# Patient Record
Sex: Female | Born: 1950
Health system: Southern US, Community
[De-identification: ages and names within clinical notes are randomized; demographics above are authoritative.]

## PROBLEM LIST (undated history)

## (undated) DIAGNOSIS — Z1211 Encounter for screening for malignant neoplasm of colon: Secondary | ICD-10-CM

## (undated) DIAGNOSIS — M1990 Unspecified osteoarthritis, unspecified site: Secondary | ICD-10-CM

## (undated) DIAGNOSIS — S62109A Fracture of unspecified carpal bone, unspecified wrist, initial encounter for closed fracture: Secondary | ICD-10-CM

## (undated) DIAGNOSIS — C801 Malignant (primary) neoplasm, unspecified: Secondary | ICD-10-CM

## (undated) DIAGNOSIS — C50919 Malignant neoplasm of unspecified site of unspecified female breast: Secondary | ICD-10-CM

## (undated) DIAGNOSIS — J40 Bronchitis, not specified as acute or chronic: Secondary | ICD-10-CM

## (undated) DIAGNOSIS — C50519 Malignant neoplasm of lower-outer quadrant of unspecified female breast: Secondary | ICD-10-CM

## (undated) DIAGNOSIS — IMO0002 Reserved for concepts with insufficient information to code with codable children: Secondary | ICD-10-CM

## (undated) DIAGNOSIS — K529 Noninfective gastroenteritis and colitis, unspecified: Secondary | ICD-10-CM

## (undated) HISTORY — DX: Fracture of unspecified carpal bone, unspecified wrist, initial encounter for closed fracture: S62.109A

## (undated) HISTORY — DX: Encounter for screening for malignant neoplasm of colon: Z12.11

## (undated) HISTORY — PX: BREAST EXCISIONAL BIOPSY: SUR124

## (undated) HISTORY — DX: Reserved for concepts with insufficient information to code with codable children: IMO0002

## (undated) HISTORY — DX: Malignant neoplasm of lower-outer quadrant of unspecified female breast: C50.519

## (undated) HISTORY — PX: TONSILLECTOMY: SUR1361

## (undated) HISTORY — DX: Unspecified osteoarthritis, unspecified site: M19.90

## (undated) HISTORY — DX: Bronchitis, not specified as acute or chronic: J40

## (undated) HISTORY — DX: Noninfective gastroenteritis and colitis, unspecified: K52.9

## (undated) HISTORY — DX: Malignant (primary) neoplasm, unspecified: C80.1

---
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
## 1978-11-23 DIAGNOSIS — K529 Noninfective gastroenteritis and colitis, unspecified: Secondary | ICD-10-CM

## 1978-11-23 HISTORY — DX: Noninfective gastroenteritis and colitis, unspecified: K52.9

## 1984-11-23 HISTORY — PX: TUBAL LIGATION: SHX77

## 2007-11-24 DIAGNOSIS — M1990 Unspecified osteoarthritis, unspecified site: Secondary | ICD-10-CM

## 2007-11-24 HISTORY — PX: FRACTURE SURGERY: SHX138

## 2007-11-24 HISTORY — PX: COLONOSCOPY: SHX174

## 2007-11-24 HISTORY — DX: Unspecified osteoarthritis, unspecified site: M19.90

## 2008-04-26 ENCOUNTER — Ambulatory Visit: Payer: Self-pay | Admitting: Gastroenterology

## 2009-07-30 ENCOUNTER — Ambulatory Visit: Payer: Self-pay | Admitting: Family Medicine

## 2009-11-23 DIAGNOSIS — C50919 Malignant neoplasm of unspecified site of unspecified female breast: Secondary | ICD-10-CM

## 2009-11-23 DIAGNOSIS — C801 Malignant (primary) neoplasm, unspecified: Secondary | ICD-10-CM

## 2009-11-23 DIAGNOSIS — C50519 Malignant neoplasm of lower-outer quadrant of unspecified female breast: Secondary | ICD-10-CM

## 2009-11-23 HISTORY — DX: Malignant neoplasm of lower-outer quadrant of unspecified female breast: C50.519

## 2009-11-23 HISTORY — DX: Malignant (primary) neoplasm, unspecified: C80.1

## 2009-11-23 HISTORY — PX: BREAST BIOPSY: SHX20

## 2009-11-23 HISTORY — PX: BREAST LUMPECTOMY: SHX2

## 2009-11-23 HISTORY — DX: Malignant neoplasm of unspecified site of unspecified female breast: C50.919

## 2009-11-23 HISTORY — PX: BREAST SURGERY: SHX581

## 2009-11-23 HISTORY — PX: BREAST MAMMOSITE: SHX5264

## 2009-11-23 HISTORY — PX: BREAST LUMPECTOMY WITH SENTINEL LYMPH NODE BIOPSY: SHX5597

## 2009-12-15 ENCOUNTER — Ambulatory Visit: Payer: Self-pay | Admitting: Specialist

## 2010-05-16 ENCOUNTER — Ambulatory Visit: Payer: Self-pay | Admitting: General Surgery

## 2010-05-22 ENCOUNTER — Ambulatory Visit: Payer: Self-pay | Admitting: General Surgery

## 2010-05-23 ENCOUNTER — Ambulatory Visit: Payer: Self-pay | Admitting: Radiation Oncology

## 2010-06-05 ENCOUNTER — Ambulatory Visit: Payer: Self-pay | Admitting: Radiation Oncology

## 2010-06-23 ENCOUNTER — Ambulatory Visit: Payer: Self-pay | Admitting: Radiation Oncology

## 2010-07-24 ENCOUNTER — Ambulatory Visit: Payer: Self-pay | Admitting: Radiation Oncology

## 2011-01-06 ENCOUNTER — Ambulatory Visit: Payer: Self-pay

## 2011-01-19 ENCOUNTER — Ambulatory Visit: Payer: Self-pay | Admitting: Radiation Oncology

## 2011-01-22 ENCOUNTER — Ambulatory Visit: Payer: Self-pay | Admitting: Radiation Oncology

## 2011-04-01 IMAGING — CT CT SIM MISC
1 series · 16 of 31 positions shown, 20 images · non-contrast
Comparison: none

[Series 2: — · axial · 1.19mm/px · z∈[-764,-576]mm · 16 of 69 slices shown, 20 images]
[im 3/69  mediastinal]
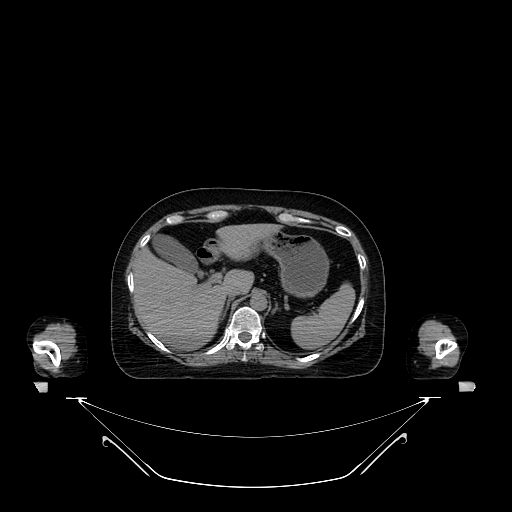
[im 3/69  lung]
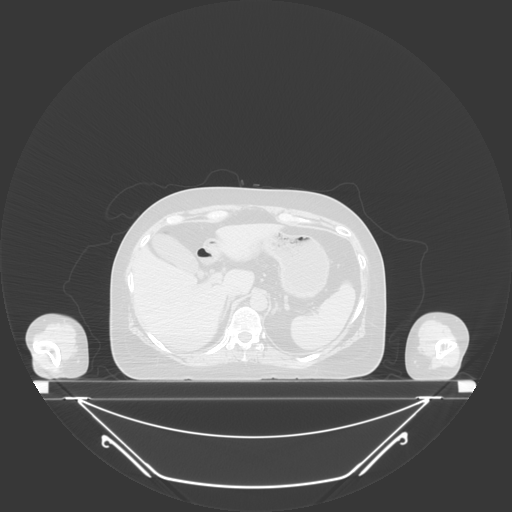
[im 8/69  lung]
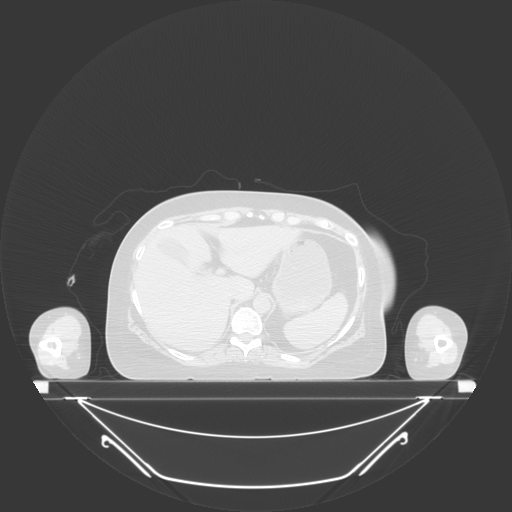
[im 13/69  lung]
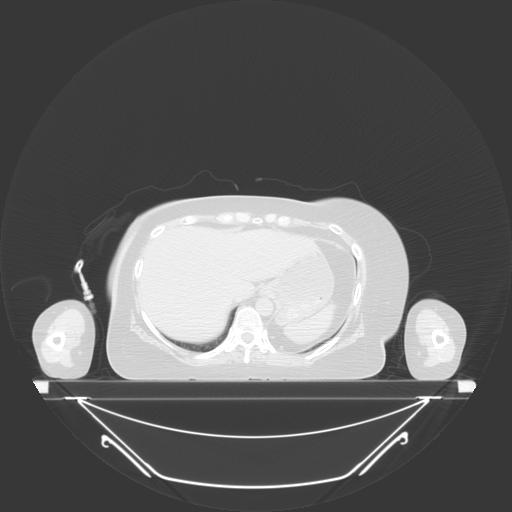
[im 16/69  lung]
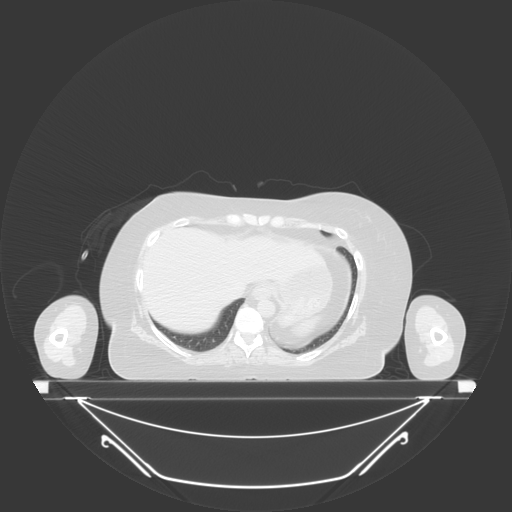
[im 21/69  mediastinal]
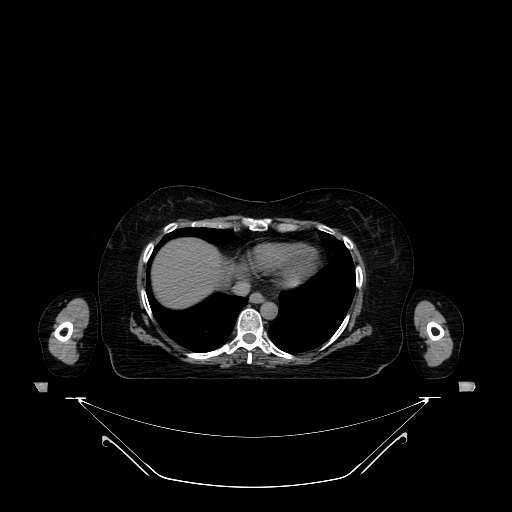
[im 21/69  lung]
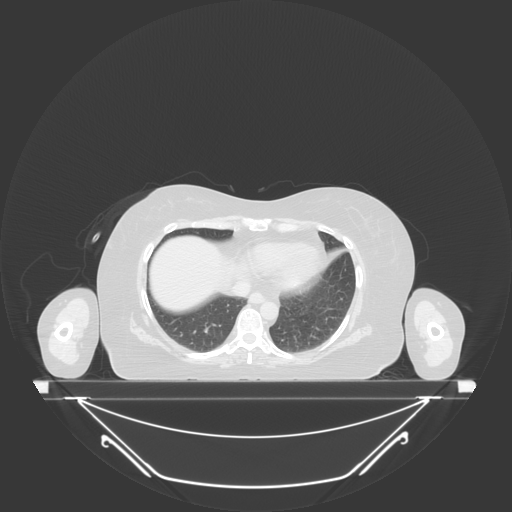
[im 23/69  lung]
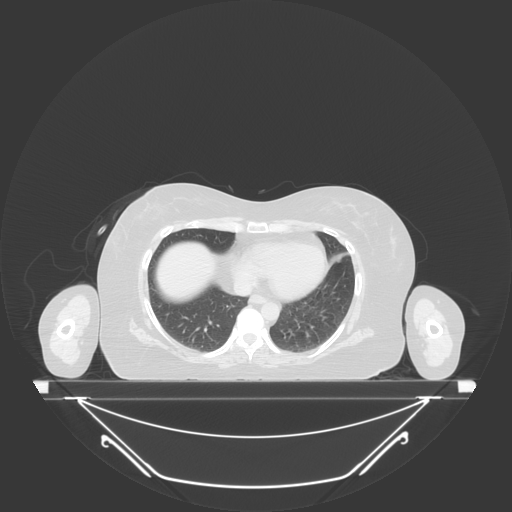
[im 28/69  lung]
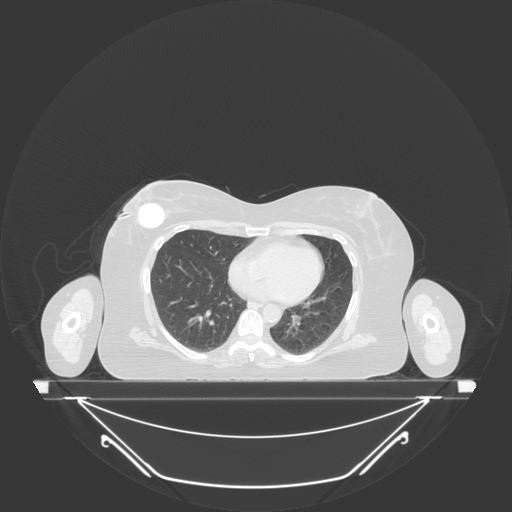
[im 32/69  lung]
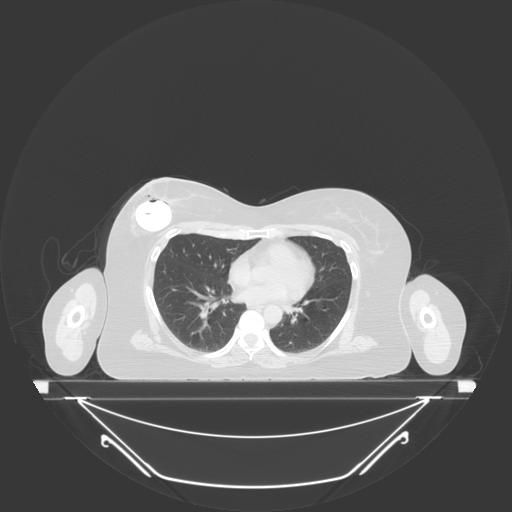
[im 36/69  mediastinal]
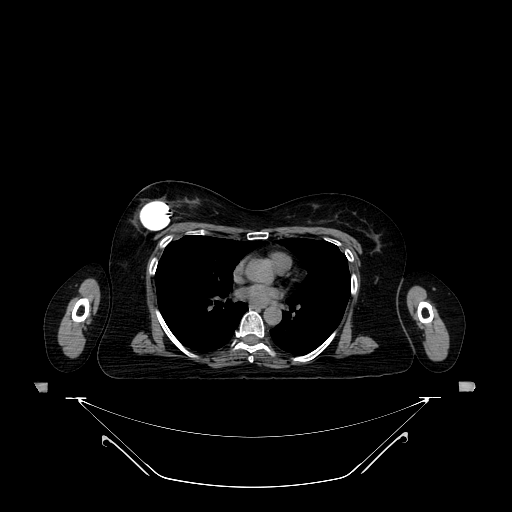
[im 36/69  lung]
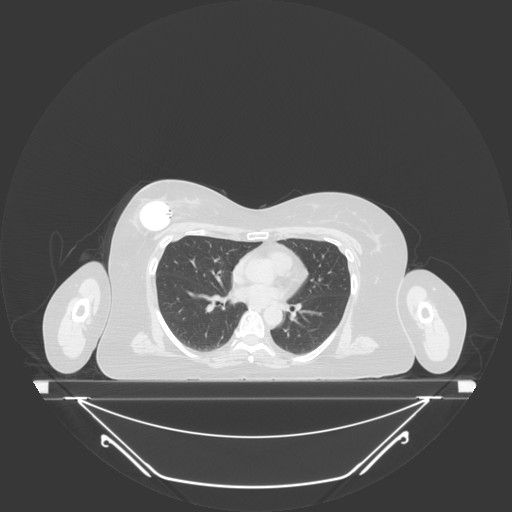
[im 41/69  lung]
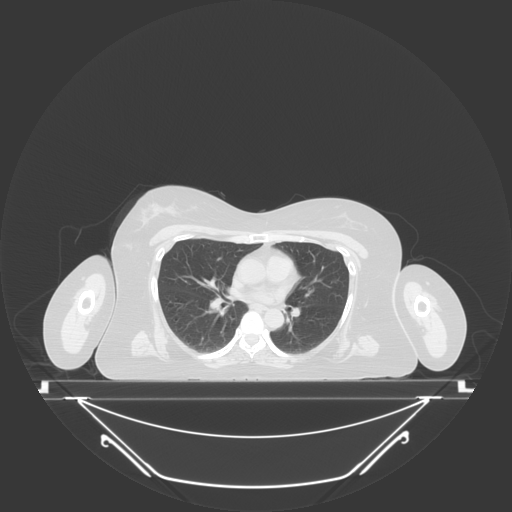
[im 46/69  lung]
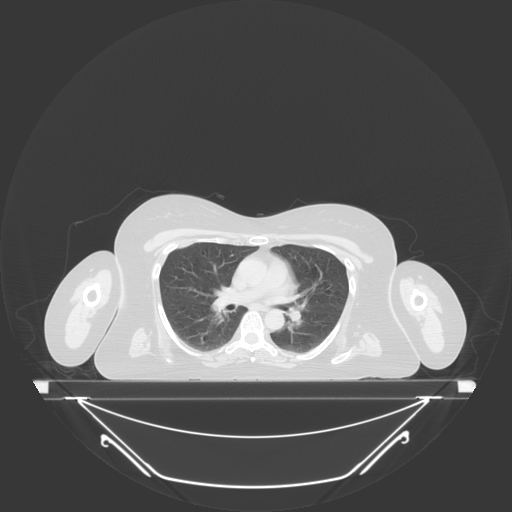
[im 48/69  lung]
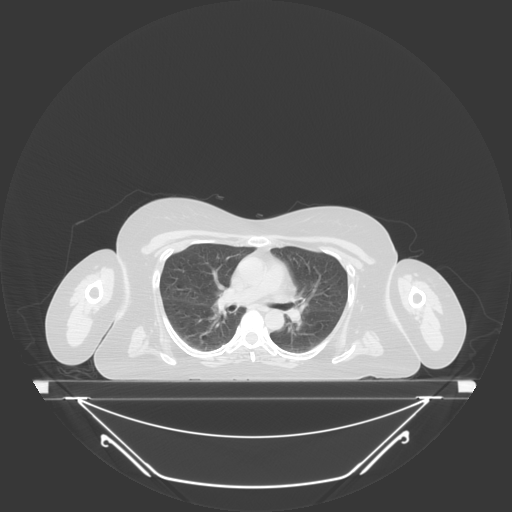
[im 53/69  mediastinal]
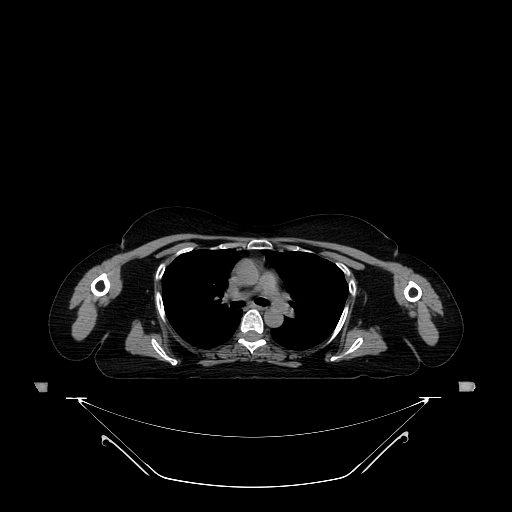
[im 53/69  lung]
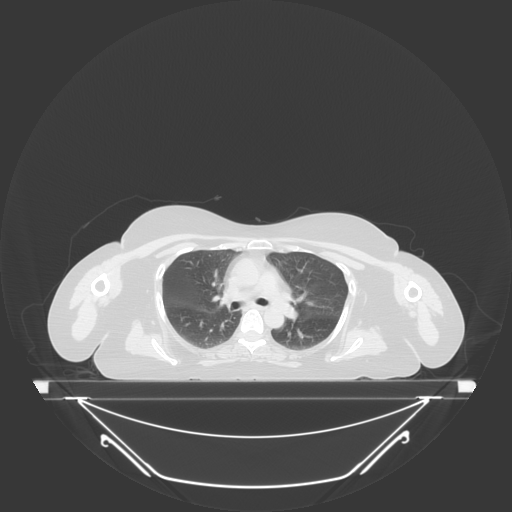
[im 56/69  lung]
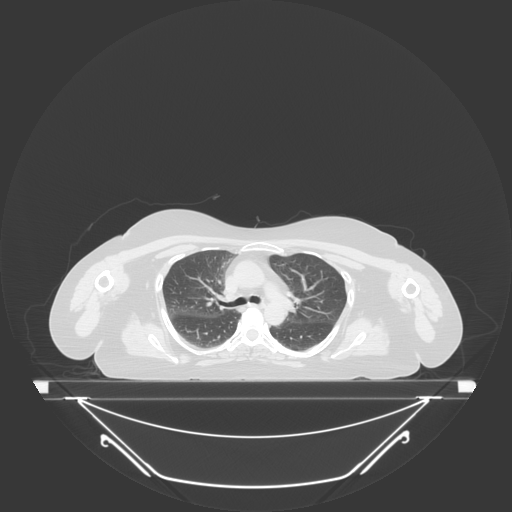
[im 61/69  lung]
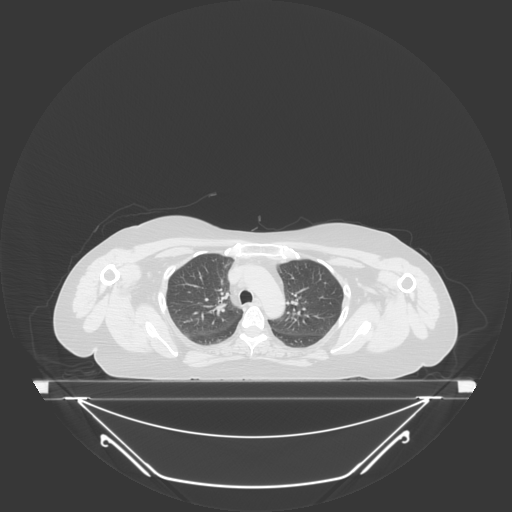
[im 66/69  lung]
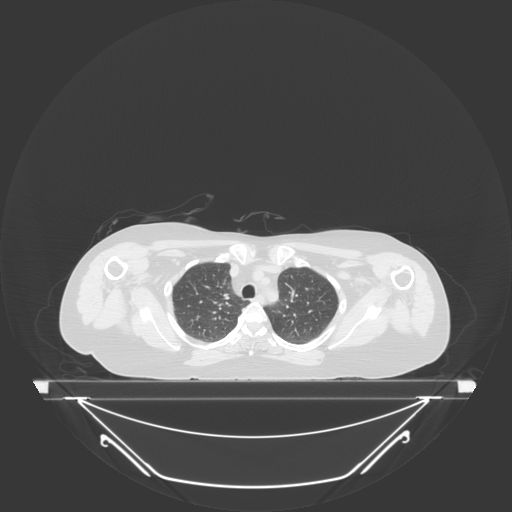

[16 of 31 positions shown; findings below may reference images not displayed]

IMAGES IMPORTED FROM THE [employer] WORKFLOW SYSTEM
NO DICTATION FOR STUDY

## 2011-07-20 ENCOUNTER — Ambulatory Visit: Payer: Self-pay | Admitting: Radiation Oncology

## 2011-07-25 ENCOUNTER — Ambulatory Visit: Payer: Self-pay | Admitting: Radiation Oncology

## 2012-07-21 ENCOUNTER — Ambulatory Visit: Payer: Self-pay | Admitting: Radiation Oncology

## 2012-07-24 ENCOUNTER — Ambulatory Visit: Payer: Self-pay | Admitting: Radiation Oncology

## 2013-04-04 ENCOUNTER — Encounter: Payer: Self-pay | Admitting: *Deleted

## 2013-04-04 DIAGNOSIS — K529 Noninfective gastroenteritis and colitis, unspecified: Secondary | ICD-10-CM | POA: Insufficient documentation

## 2013-04-04 DIAGNOSIS — C801 Malignant (primary) neoplasm, unspecified: Secondary | ICD-10-CM | POA: Insufficient documentation

## 2013-06-14 ENCOUNTER — Encounter: Payer: Self-pay | Admitting: General Surgery

## 2013-06-27 ENCOUNTER — Ambulatory Visit: Payer: Self-pay | Admitting: General Surgery

## 2013-06-27 ENCOUNTER — Ambulatory Visit: Payer: Self-pay | Admitting: Ophthalmology

## 2013-07-18 ENCOUNTER — Encounter: Payer: Self-pay | Admitting: General Surgery

## 2013-07-18 ENCOUNTER — Other Ambulatory Visit: Payer: Self-pay

## 2013-07-18 ENCOUNTER — Ambulatory Visit (INDEPENDENT_AMBULATORY_CARE_PROVIDER_SITE_OTHER): Payer: BC Managed Care – PPO | Admitting: General Surgery

## 2013-07-18 VITALS — BP 138/80 | HR 72 | Resp 13 | Ht 65.0 in | Wt 166.0 lb

## 2013-07-18 DIAGNOSIS — Z853 Personal history of malignant neoplasm of breast: Secondary | ICD-10-CM

## 2013-07-18 MED ORDER — LETROZOLE 2.5 MG PO TABS: 2.5000 mg | ORAL_TABLET | Freq: Every day | ORAL | Status: DC

## 2013-07-18 NOTE — Patient Instructions (Addendum)
Patient to return in four months right breast diagnotic mammogram.

## 2013-07-18 NOTE — Progress Notes (Signed)
Patient ID: Lacey Jordan, female   DOB: 08/17/1951, 62 y.o.   MRN: [id number]  Chief Complaint  Patient presents with  . Follow-up    mammogram    HPI Lacey Jordan is a 62 y.o. female who presents for a breast evaluation. The most recent mammogram was done on 06/13/13 . Patient does perform regular self breast checks and gets regular mammograms done.    HPI  Past Medical History  Diagnosis Date  . Arthritis 2009  . Cancer 2011    right breast, L/SN/R stage 1 cancer ER/PR pos HER 2 negative  . Personal history of tobacco use, presenting hazards to health   . Ulcer 1969    stomach  . Personal history of malignant neoplasm of breast 2011  . Special screening for malignant neoplasms, colon   . Wrist fracture     age of 75  . Colitis 1980  . Malignant neoplasm of lower-outer quadrant of female breast 2011    Past Surgical History  Procedure Laterality Date  . Colonoscopy  2009  . Breast mammosite Right 2011  . Breast surgery Right 2011    stereo  . Breast lumpectomy with sentinel lymph node biopsy Right 2011  . Fracture surgery Left 2009  . Tubal ligation  1986  . Breast biopsy Right     age of 41  . Tonsillectomy      age of 5 yrs    Family History  Problem Relation Age of Onset  . Breast cancer      father sister    Social History History  Substance Use Topics  . Smoking status: Former Smoker -- 1.00 packs/day for 20 years    Types: Cigarettes  . Smokeless tobacco: Never Used     Comment: quit in 2002  . Alcohol Use: Yes     Comment: drinks wine/once per month    Allergies  Allergen Reactions  . Nickel Rash    Current Outpatient Prescriptions  Medication Sig Dispense Refill  . benazepril-hydrochlorthiazide (LOTENSIN HCT) 10-12.5 MG per tablet Take 1 tablet by mouth daily.      . Cholecalciferol (VITAMIN D-3) 1000 UNITS CAPS Take 1 capsule by mouth daily.      Marland Kitchen letrozole (FEMARA) 2.5 MG tablet Take 2.5 mg by mouth daily.      . meloxicam  (MOBIC) 15 MG tablet Take 15 mg by mouth as needed for pain.      . vitamin B-12 (CYANOCOBALAMIN) 1000 MCG tablet Take 1,000 mcg by mouth daily.      . vitamin E 100 UNIT capsule Take 100 Units by mouth daily.      Marland Kitchen letrozole (FEMARA) 2.5 MG tablet Take 1 tablet (2.5 mg total) by mouth daily.  30 tablet  12   No current facility-administered medications for this visit.    Review of Systems Review of Systems  Constitutional: Negative.   Respiratory: Negative.   Cardiovascular: Negative.     Blood pressure 138/80, pulse 72, resp. rate 13, height 5\' 5"  (1.651 m), weight 166 lb (75.297 kg).  Physical Exam Physical Exam  Constitutional: She is oriented to person, place, and time. She appears well-developed and well-nourished.  Eyes: Conjunctivae are normal. No scleral icterus.  Neck: Neck supple. No tracheal deviation present. No mass and no thyromegaly present.  Cardiovascular: Normal rate, regular rhythm, normal heart sounds, intact distal pulses and normal pulses.   Pulses:      Dorsalis pedis pulses are 2+ on the right side, and 2+  on the left side.       Posterior tibial pulses are 2+ on the right side, and 2+ on the left side.  No edema   Pulmonary/Chest: Breath sounds normal. Right breast exhibits no inverted nipple, no mass, no nipple discharge, no skin change and no tenderness. Left breast exhibits no inverted nipple, no mass, no nipple discharge, no skin change and no tenderness.  Right breast scar from lumpectomy site some firm areas.  Abdominal: Soft. Bowel sounds are normal. There is no hepatosplenomegaly. There is no tenderness. No hernia.  Lymphadenopathy:    She has no cervical adenopathy.    She has no axillary adenopathy.  Neurological: She is alert and oriented to person, place, and time.  Skin: Skin is warm and dry.    Data Reviewed Mammogram reviewed . Dentistry  seen at lumpectomy site with some new calcifications   Assessment   Performed right breast  ultrasound -shadowing area with mixed echo pattern- more likely fat necrosis. Exam is stable. She is now 47yrs post treatment for right breast cancer. On letrazole and doing well.     Plan    Patient to return in four months with right breast diagnotic mammogram.       Yaire Kreher G 07/18/2013, 10:00 AM

## 2013-07-25 ENCOUNTER — Ambulatory Visit: Payer: Self-pay | Admitting: Ophthalmology

## 2013-11-20 ENCOUNTER — Encounter: Payer: Self-pay | Admitting: General Surgery

## 2013-11-21 ENCOUNTER — Encounter: Payer: Self-pay | Admitting: General Surgery

## 2013-11-21 ENCOUNTER — Ambulatory Visit (INDEPENDENT_AMBULATORY_CARE_PROVIDER_SITE_OTHER): Payer: BC Managed Care – PPO | Admitting: General Surgery

## 2013-11-21 VITALS — BP 140/82 | HR 76 | Resp 12 | Ht 65.0 in | Wt 169.0 lb

## 2013-11-21 DIAGNOSIS — Z853 Personal history of malignant neoplasm of breast: Secondary | ICD-10-CM

## 2013-11-21 NOTE — Patient Instructions (Signed)
Patient to return in seven month bilateral diagnotic mammogram.

## 2013-11-21 NOTE — Progress Notes (Signed)
Patient ID: Lacey Jordan, female   DOB: 05-Nov-1951, 62 y.o.   MRN: [id number]  Chief Complaint  Patient presents with  . Follow-up    mammogram    HPI Lacey Jordan is a 62 y.o. female who presents for a breast evaluation. The most recent right breast  mammogram was done on 11/09/13 at [facility]Patient does perform regular self breast checks and gets regular mammograms done. Five months ago she had mild density and calcifications in right breast and is here for f/u  HPI  Past Medical History  Diagnosis Date  . Arthritis 2009  . Cancer 2011    right breast, L/SN/R stage 1 cancer ER/PR pos HER 2 negative  . Personal history of tobacco use, presenting hazards to health   . Ulcer 1969    stomach  . Personal history of malignant neoplasm of breast 2011  . Special screening for malignant neoplasms, colon   . Wrist fracture     age of 8  . Colitis 1980  . Malignant neoplasm of lower-outer quadrant of female breast 2011    Past Surgical History  Procedure Laterality Date  . Colonoscopy  2009  . Breast mammosite Right 2011  . Breast surgery Right 2011    stereo  . Breast lumpectomy with sentinel lymph node biopsy Right 2011  . Fracture surgery Left 2009  . Tubal ligation  1986  . Breast biopsy Right     age of 22  . Tonsillectomy      age of 5 yrs    Family History  Problem Relation Age of Onset  . Breast cancer      father sister    Social History History  Substance Use Topics  . Smoking status: Former Smoker -- 1.00 packs/day for 20 years    Types: Cigarettes  . Smokeless tobacco: Never Used     Comment: quit in 2002  . Alcohol Use: Yes     Comment: drinks wine/once per month    Allergies  Allergen Reactions  . Nickel Rash    Current Outpatient Prescriptions  Medication Sig Dispense Refill  . benazepril-hydrochlorthiazide (LOTENSIN HCT) 10-12.5 MG per tablet Take 1 tablet by mouth daily.      . Cholecalciferol (VITAMIN D-3) 1000 UNITS CAPS Take 1 capsule by  mouth daily.      Marland Kitchen letrozole (FEMARA) 2.5 MG tablet Take 1 tablet (2.5 mg total) by mouth daily.  30 tablet  12  . meloxicam (MOBIC) 15 MG tablet Take 15 mg by mouth as needed for pain.      . vitamin B-12 (CYANOCOBALAMIN) 1000 MCG tablet Take 1,000 mcg by mouth daily.      . vitamin E 100 UNIT capsule Take 100 Units by mouth daily.       No current facility-administered medications for this visit.    Review of Systems Review of Systems  Constitutional: Negative.   Respiratory: Negative.   Cardiovascular: Negative.     Blood pressure 140/82, pulse 76, resp. rate 12, height 5\' 5"  (1.651 m), weight 169 lb (76.658 kg).  Physical Exam Physical Exam  Constitutional: She appears well-developed and well-nourished.  Eyes: No scleral icterus.  Pulmonary/Chest: Right breast exhibits no inverted nipple, no mass, no nipple discharge, no skin change and no tenderness. Left breast exhibits no inverted nipple, no mass, no nipple discharge, no skin change and no tenderness.  Irregular   feel at lumpectomy site right breast  Lymphadenopathy:    She has no cervical adenopathy.  She has no axillary adenopathy.    Data Reviewed Mammogram right reviewed. Stable findings in lumpectomy  site.    Assessment    Stable exam     Plan   Patient to return in seven months with bilateral diagnotic mammogram.        Gerlene Burdock G 11/22/2013, 4:25 PM

## 2013-11-22 ENCOUNTER — Encounter: Payer: Self-pay | Admitting: General Surgery

## 2013-12-11 ENCOUNTER — Encounter: Payer: Self-pay | Admitting: General Surgery

## 2014-02-22 ENCOUNTER — Telehealth: Payer: Self-pay | Admitting: *Deleted

## 2014-02-22 MED ORDER — LETROZOLE 2.5 MG PO TABS: 2.5000 mg | ORAL_TABLET | Freq: Every day | ORAL | Status: DC

## 2014-02-22 NOTE — Telephone Encounter (Signed)
Patient would like a mail order RX sent. She will call back with contact information.

## 2014-07-02 ENCOUNTER — Encounter: Payer: Self-pay | Admitting: General Surgery

## 2014-07-03 ENCOUNTER — Other Ambulatory Visit: Payer: BC Managed Care – PPO

## 2014-07-03 ENCOUNTER — Encounter: Payer: Self-pay | Admitting: General Surgery

## 2014-07-03 ENCOUNTER — Ambulatory Visit (INDEPENDENT_AMBULATORY_CARE_PROVIDER_SITE_OTHER): Payer: BC Managed Care – PPO | Admitting: General Surgery

## 2014-07-03 VITALS — BP 156/86 | HR 78 | Resp 12 | Ht 65.5 in | Wt 164.0 lb

## 2014-07-03 DIAGNOSIS — N63 Unspecified lump in unspecified breast: Secondary | ICD-10-CM

## 2014-07-03 DIAGNOSIS — R928 Other abnormal and inconclusive findings on diagnostic imaging of breast: Secondary | ICD-10-CM

## 2014-07-03 DIAGNOSIS — Z853 Personal history of malignant neoplasm of breast: Secondary | ICD-10-CM

## 2014-07-03 NOTE — Patient Instructions (Addendum)
Continue self breast exams. Call office for any new breast issues or concerns. Stereotactic Breast Biopsy A stereotactic breast biopsy is a procedure in which mammography is used in the collection of a sample of breast tissue. Mammography is a type of X-ray exam of the breasts that produces an image called a mammogram. The mammogram allows your health care provider to precisely locate the area of the breast from which a tissue sample will be taken. The tissue is then examined under a microscope to see if cancerous cells are present. A breast biopsy is done when:   A lump, abnormality, or mass is seen in the breast on a breast X-ray (mammogram).   Small calcium deposits (calcifications) are seen in the breast.   The shape or appearance of the breasts changes.   The shape or appearance of the nipples changes. You may have unusual or bloody discharge coming from the nipples, or you may have crusting, retraction, or dimpling of the nipples. A breast biopsy can indicate if you need surgery or other treatment.  LET [facility] CARE PROVIDER KNOW ABOUT:  Any allergies you have.  All medicines you are taking, including vitamins, herbs, eye drops, creams, and over-the-counter medicines.  Previous problems you or members of your family have had with the use of anesthetics.  Any blood disorders you have.  Previous surgeries you have had.  Medical conditions you have. RISKS AND COMPLICATIONS Generally, stereotactic breast biopsy is a safe procedure. However, as with any procedure, complications can occur. Possible complications include:  Infection at the needle-insertion site.   Bleeding or bruising after surgery.  The breast may become altered or deformed as a result of the procedure.  The needle may go through the chest wall into the lung area.  BEFORE THE PROCEDURE  Wear a supportive bra to the procedure.  You will be asked to remove jewelry, dentures, eyeglasses, metal objects,  or clothing that might interfere with the X-ray images. You may want to leave some of these objects at home.  Arrange for someone to drive you home after the procedure if desired. PROCEDURE  A stereotactic breast biopsy is done while you are awake. During the procedure, relax as much as possible. Let your health care provider know if you are uncomfortable, anxious, or in pain. Usually, the only discomfort felt during the procedure is caused by staying in one position for the length of the procedure. This discomfort can be reduced by carefully placed cushions. Most of the time the biopsy is done using a table with openings on it. You will be asked to lie facedown on the table and place your breasts through the openings. Your breast is compressed between metal plates to get good X-ray images. Your skin will be cleaned, and a numbing medicine (local anesthetic) will be injected. A small cut (incision) will be made in your breast. The tip of the biopsy needle will be directed through the incision. Several small pieces of suspicious tissue will be taken. Then, a final set of X-ray images will be obtained. If they show that the suspicious tissue has been mostly or completely removed, a small clip will be left at the biopsy site. This is done so that the biopsy site can be easily located if the results of the biopsy show that the tissue is cancerous.  After the procedure, the incision will be stitched (sutured) or taped and covered with a bandage (dressing). Your health care provider may apply a pressure dressing and an  ice pack to prevent bleeding and swelling in the breast.  A stereotactic breast biopsy can take 30 minutes or more. AFTER THE PROCEDURE  If you are doing well and have no problems, you will be allowed to go home.  Document Released: 08/08/2003 Document Revised: 11/14/2013 Document Reviewed: 06/08/2013 [facility] Patient Information 2015 [place], [place]. This information is not intended to replace  advice given to you by your health care provider. Make sure you discuss any questions you have with your health care provider.  Patient has been scheduled for a right breast stereotactic biopsy at [facility] for 07/09/14 at 1:00 pm. She will check-in at the [facility] at 12:45 pm. This patient is aware of date, time, and instructions. Patient verbalizes understanding

## 2014-07-03 NOTE — Progress Notes (Signed)
Patient ID: Lacey Jordan, female   DOB: [birth year], 63 y.o.   MRN: [id number]  Chief Complaint  Patient presents with  . Follow-up    bilateral diagnostic mammogram    HPI Lacey Jordan is a 63 y.o. female.  who presents for her follow up bilateral diagnostic mammogram and breast cancer follow up. The most recent mammogram was done on 06-22-14.  Patient does perform regular self breast checks and gets regular mammograms done.   No new breast issues. Tolerating Femara.  HPI  Past Medical History  Diagnosis Date  . Arthritis 2009  . Cancer 2011    right breast, L/SN/R stage 1 cancer ER/PR pos HER 2 negative  . Personal history of tobacco use, presenting hazards to health   . Ulcer 1969    stomach  . Personal history of malignant neoplasm of breast 2011  . Special screening for malignant neoplasms, colon   . Wrist fracture     age of 34  . Colitis 1980  . Malignant neoplasm of lower-outer quadrant of female breast 2011  . Bronchitis     Past Surgical History  Procedure Laterality Date  . Colonoscopy  2009  . Breast mammosite Right 2011  . Breast surgery Right 2011    stereo  . Breast lumpectomy with sentinel lymph node biopsy Right 2011  . Fracture surgery Left 2009  . Tubal ligation  1986  . Breast biopsy Right     age of 89  . Tonsillectomy      age of 37 yrs    Family History  Problem Relation Age of Onset  . Breast cancer      father sister    Social History History  Substance Use Topics  . Smoking status: Former Smoker -- 1.00 packs/day for 20 years    Types: Cigarettes  . Smokeless tobacco: Never Used     Comment: quit in 2002  . Alcohol Use: Yes     Comment: drinks wine/once per month    Allergies  Allergen Reactions  . Adhesive [Tape] Rash    Band aid sensitivity  . Nickel Rash    Current Outpatient Prescriptions  Medication Sig Dispense Refill  . letrozole (FEMARA) 2.5 MG tablet Take 1 tablet (2.5 mg total) by mouth daily.  90 tablet  3   . meloxicam (MOBIC) 15 MG tablet Take 15 mg by mouth as needed for pain.       No current facility-administered medications for this visit.    Review of Systems Review of Systems  Constitutional: Negative.   Respiratory: Negative.   Cardiovascular: Negative.     Blood pressure 156/86, pulse 78, resp. rate 12, height 5' 5.5" (1.664 m), weight 164 lb (74.39 kg).  Physical Exam Physical Exam  Constitutional: She is oriented to person, place, and time. She appears well-developed and well-nourished.  Eyes: Conjunctivae are normal. No scleral icterus.  Neck: Neck supple.  Cardiovascular: Normal rate, regular rhythm and normal heart sounds.   No lower leg edema  Pulmonary/Chest: Effort normal and breath sounds normal. Right breast exhibits mass. Right breast exhibits no inverted nipple, no nipple discharge, no skin change and no tenderness. Left breast exhibits no inverted nipple, no mass, no nipple discharge, no skin change and no tenderness.  Right breast 2 x 1 cm hard mass at 9 o'clock just outside areolar   Abdominal: Soft. There is no hepatosplenomegaly.  Lymphadenopathy:    She has no cervical adenopathy.    She has no axillary adenopathy.  Neurological: She is alert and oriented to person, place, and time.  Skin: Skin is warm and dry.    Data Reviewed Mammogram reviewed. There is new group of microcalcification retro areolar on the right breast. In addition there are coarse calcifications present. Ultrasound of the palpable mass shows an area of shadowing without a defined mass.  Assessment    New findings right breast particularly the retroareola micrcalcifications. Palpable mass and US findings are lateral to area of micro calcs seen in retro areolar area S/P lumpectomy, SN biopsy and radiation for stage 1 CA right breast in 2011.    Plan    Discussed and reviewed a right breast stereotatic biopsy.    Patient has been scheduled for a right breast stereotactic biopsy  at [facility] for 07/09/14 at 1:00 pm. She will check-in at the [facility] at 12:45 pm. This patient is aware of date, time, and instructions. Patient verbalizes understanding.    Markees Carns G 07/03/2014, 9:57 AM

## 2014-07-09 ENCOUNTER — Ambulatory Visit: Payer: Self-pay | Admitting: General Surgery

## 2014-07-09 DIAGNOSIS — R920 Mammographic microcalcification found on diagnostic imaging of breast: Secondary | ICD-10-CM

## 2014-07-09 HISTORY — PX: BREAST BIOPSY: SHX20

## 2014-07-11 ENCOUNTER — Telehealth: Payer: Self-pay | Admitting: *Deleted

## 2014-07-11 LAB — PATHOLOGY REPORT

## 2014-07-11 NOTE — Telephone Encounter (Signed)
Notified patient as instructed, patient pleased. Right breast biopsy showed fat necrosis with calcifications, benign pathology called by Dr. Reuel Derby. Discussed follow-up appointments, patient agrees. Placed in recalls for 6 months.

## 2014-07-12 ENCOUNTER — Encounter: Payer: Self-pay | Admitting: General Surgery

## 2014-07-16 ENCOUNTER — Ambulatory Visit (INDEPENDENT_AMBULATORY_CARE_PROVIDER_SITE_OTHER): Payer: Self-pay | Admitting: *Deleted

## 2014-07-16 DIAGNOSIS — N63 Unspecified lump in unspecified breast: Secondary | ICD-10-CM

## 2014-07-16 NOTE — Progress Notes (Signed)
Patient here today for follow up post right  breast stereo. Steristrip in place and aware it may come off in one week.  Minimal bruising noted.  The patient is aware that a heating pad may be used for comfort as needed.  Aware of pathology. Follow up as scheduled. 

## 2014-08-03 ENCOUNTER — Encounter: Payer: Self-pay | Admitting: General Surgery

## 2014-09-17 ENCOUNTER — Telehealth: Payer: Self-pay | Admitting: *Deleted

## 2014-09-17 NOTE — Telephone Encounter (Signed)
Pt called and said that she had a RT breast stereotactic BX done on 07/09/14 at [facility] by Dr.Sankar and her insurance company is telling her they will not pay due to the wrong code entered. She called at [facility] and [facility] and they both today her to call here to get that code changed. [facility] said that the Dr has to change the code that they couldn't do anything about it. She couldn't remember the name of the person who she talked to over there. So she is calling to see if she can get the code changed to the right one.

## 2014-09-24 ENCOUNTER — Encounter: Payer: Self-pay | Admitting: General Surgery

## 2015-01-01 ENCOUNTER — Encounter: Payer: Self-pay | Admitting: General Surgery

## 2015-01-01 ENCOUNTER — Ambulatory Visit: Payer: Self-pay | Admitting: General Surgery

## 2015-01-01 ENCOUNTER — Ambulatory Visit: Admit: 2015-01-01 | Disposition: A | Discharge status: Home / Self Care | Payer: Self-pay | Admitting: General Surgery

## 2015-01-08 ENCOUNTER — Other Ambulatory Visit: Payer: Self-pay

## 2015-01-08 ENCOUNTER — Ambulatory Visit (INDEPENDENT_AMBULATORY_CARE_PROVIDER_SITE_OTHER): Payer: Private Health Insurance - Indemnity | Admitting: General Surgery

## 2015-01-08 ENCOUNTER — Encounter: Payer: Self-pay | Admitting: General Surgery

## 2015-01-08 VITALS — BP 130/84 | HR 88 | Resp 12 | Ht 67.0 in | Wt 166.0 lb

## 2015-01-08 DIAGNOSIS — Z853 Personal history of malignant neoplasm of breast: Secondary | ICD-10-CM

## 2015-01-08 DIAGNOSIS — Z1211 Encounter for screening for malignant neoplasm of colon: Secondary | ICD-10-CM

## 2015-01-08 MED ORDER — POLYETHYLENE GLYCOL 3350 17 GM/SCOOP PO POWD: 1.0000 | Freq: Once | ORAL | Status: DC

## 2015-01-08 NOTE — Progress Notes (Signed)
Patient ID: Lacey Jordan, female   DOB: 1951/01/02, 64 y.o.   MRN: [id number]  Chief Complaint  Patient presents with  . Follow-up    mammogram    HPI Lacey Jordan is a 64 y.o. female who presents for a breast cancer follow up. The most recent right breast  mammogram was done on 01/01/15 . 44mos ago she had stereo biopsy of new microcalcs in right breast in lumpectomy site- fat necrosis. Patient does perform regular self breast checks and gets regular mammograms done.  Patient wants to discuss a colonoscopy also.    HPI  Past Medical History  Diagnosis Date  . Arthritis 2009  . Cancer 2011    right breast, L/SN/R stage 1 cancer ER/PR pos HER 2 negative  . Ulcer 1969    stomach  . Special screening for malignant neoplasms, colon   . Wrist fracture     age of 89  . Colitis 1980  . Malignant neoplasm of lower-outer quadrant of female breast 2011  . Bronchitis     Past Surgical History  Procedure Laterality Date  . Colonoscopy  2009  . Breast mammosite Right 2011  . Breast surgery Right 2011    stereo  . Breast lumpectomy with sentinel lymph node biopsy Right 2011  . Fracture surgery Left 2009  . Tubal ligation  1986  . Breast biopsy Right     age of 53  . Tonsillectomy      age of 61 yrs    Family History  Problem Relation Age of Onset  . Breast cancer      father sister    Social History History  Substance Use Topics  . Smoking status: Former Smoker -- 1.00 packs/day for 20 years    Types: Cigarettes  . Smokeless tobacco: Never Used     Comment: quit in 2002  . Alcohol Use: Yes     Comment: drinks wine/once per month    Allergies  Allergen Reactions  . Adhesive [Tape] Rash    Band aid sensitivity  . Nickel Rash    Current Outpatient Prescriptions  Medication Sig Dispense Refill  . letrozole (FEMARA) 2.5 MG tablet Take 1 tablet (2.5 mg total) by mouth daily. 90 tablet 3  . meloxicam (MOBIC) 15 MG tablet Take 15 mg by mouth as needed for pain.      No current facility-administered medications for this visit.    Review of Systems Review of Systems  Constitutional: Negative.   Respiratory: Negative.   Cardiovascular: Negative.     Blood pressure 130/84, pulse 88, resp. rate 12, height 5\' 7"  (1.702 m), weight 166 lb (75.297 kg).  Physical Exam Physical Exam  Constitutional: She is oriented to person, place, and time. She appears well-developed and well-nourished.  Eyes: No scleral icterus.  Neck: Neck supple.  Cardiovascular: Normal rate, regular rhythm and normal heart sounds.   Pulmonary/Chest: Effort normal and breath sounds normal. Right breast exhibits no inverted nipple, no mass, no nipple discharge, no skin change and no tenderness. Left breast exhibits no inverted nipple, no mass, no nipple discharge, no skin change and no tenderness.  Abdominal: Soft. Normal appearance and bowel sounds are normal. There is no hepatomegaly. There is no tenderness. No hernia.  Lymphadenopathy:    She has no cervical adenopathy.  Neurological: She is alert and oriented to person, place, and time.  Skin: Skin is warm and dry.    Data Reviewed Mammogram reviewed showing post treatment changes in right breast- no  new concerns Last colonoscopy-few benign polyps removed  Assessment    Stable exam. Pt is 4 and 1/2 yrs out from her initial cancer treatment-L/SN/R, and is continued on [place]    Patient to return in in 6 months bilateral diagnotic mammogram. Discussed colonoscopy, Patient agrees.     Jaelon Gatley G 01/08/2015, 4:10 PM

## 2015-01-08 NOTE — Progress Notes (Signed)
Patient is scheduled for a colonoscopy at [facility] on 01/23/15. She is aware to pre register with the hospital at least 2 days prior. Miralax prescription has been sent into her pharmacy. Patient is aware of date and instructions.

## 2015-01-08 NOTE — Patient Instructions (Signed)
Colonoscopy  A colonoscopy is an exam to look at the entire large intestine (colon). This exam can help find problems such as tumors, polyps, inflammation, and areas of bleeding. The exam takes about 1 hour.   LET YOUR HEALTH CARE PROVIDER KNOW ABOUT:   · Any allergies you have.  · All medicines you are taking, including vitamins, herbs, eye drops, creams, and over-the-counter medicines.  · Previous problems you or members of your family have had with the use of anesthetics.  · Any blood disorders you have.  · Previous surgeries you have had.  · Medical conditions you have.  RISKS AND COMPLICATIONS   Generally, this is a safe procedure. However, as with any procedure, complications can occur. Possible complications include:  · Bleeding.  · Tearing or rupture of the colon wall.  · Reaction to medicines given during the exam.  · Infection (rare).  BEFORE THE PROCEDURE   · Ask your health care provider about changing or stopping your regular medicines.  · You may be prescribed an oral bowel prep. This involves drinking a large amount of medicated liquid, starting the day before your procedure. The liquid will cause you to have multiple loose stools until your stool is almost clear or light green. This cleans out your colon in preparation for the procedure.  · Do not eat or drink anything else once you have started the bowel prep, unless your health care provider tells you it is safe to do so.  · Arrange for someone to drive you home after the procedure.  PROCEDURE   · You will be given medicine to help you relax (sedative).  · You will lie on your side with your knees bent.  · A long, flexible tube with a light and camera on the end (colonoscope) will be inserted through the rectum and into the colon. The camera sends video back to a computer screen as it moves through the colon. The colonoscope also releases carbon dioxide gas to inflate the colon. This helps your health care provider see the area better.  · During  the exam, your health care provider may take a small tissue sample (biopsy) to be examined under a microscope if any abnormalities are found.  · The exam is finished when the entire colon has been viewed.  AFTER THE PROCEDURE   · Do not drive for 24 hours after the exam.  · You may have a small amount of blood in your stool.  · You may pass moderate amounts of gas and have mild abdominal cramping or bloating. This is caused by the gas used to inflate your colon during the exam.  · Ask when your test results will be ready and how you will get your results. Make sure you get your test results.  Document Released: 11/06/2000 Document Revised: 08/30/2013 Document Reviewed: 07/17/2013  Exit[employer]® Patient Information ©2015 [employer]. This information is not intended to replace advice given to you by your health care provider. Make sure you discuss any questions you have with your health care provider.

## 2015-01-14 ENCOUNTER — Other Ambulatory Visit: Payer: Self-pay | Admitting: General Surgery

## 2015-01-14 DIAGNOSIS — Z86010 Personal history of colonic polyps: Secondary | ICD-10-CM

## 2015-01-23 ENCOUNTER — Ambulatory Visit: Payer: Self-pay | Admitting: General Surgery

## 2015-01-23 DIAGNOSIS — K5730 Diverticulosis of large intestine without perforation or abscess without bleeding: Secondary | ICD-10-CM | POA: Diagnosis not present

## 2015-01-23 DIAGNOSIS — Z1211 Encounter for screening for malignant neoplasm of colon: Secondary | ICD-10-CM | POA: Diagnosis not present

## 2015-01-23 DIAGNOSIS — K621 Rectal polyp: Secondary | ICD-10-CM | POA: Diagnosis not present

## 2015-01-24 ENCOUNTER — Encounter: Payer: Self-pay | Admitting: General Surgery

## 2015-01-25 ENCOUNTER — Encounter: Payer: Self-pay | Admitting: General Surgery

## 2015-01-29 ENCOUNTER — Telehealth: Payer: Self-pay | Admitting: *Deleted

## 2015-01-29 NOTE — Telephone Encounter (Signed)
Notified patient as instructed, patient pleased. Discussed follow-up appointments, patient agrees  

## 2015-01-29 NOTE — Telephone Encounter (Signed)
-----   Message from Christene Lye, MD sent at 01/28/2015 10:56 AM EST ----- Please let pt pt know the pathology was normal.

## 2015-03-15 NOTE — Op Note (Signed)
PATIENT NAME:  Lacey Jordan, Lacey Jordan MR#:  [id number] DATE OF BIRTH:  01-20-1951  DATE OF PROCEDURE:  06/27/2013  PREOPERATIVE DIAGNOSIS: Visually significant cataract of the left eye.   POSTOPERATIVE DIAGNOSIS: Visually significant cataract of the left eye.   OPERATIVE PROCEDURE: Cataract extraction by phacoemulsification with implant of intraocular lens to left eye.   SURGEON: Birder Robson, MD.   ANESTHESIA:  1. Managed anesthesia care.  2. Topical tetracaine drops followed by 2% Xylocaine jelly applied in the preoperative holding area.   COMPLICATIONS: None.   TECHNIQUE: Stop and chop.  DESCRIPTION OF PROCEDURE: The patient was examined and consented in the preoperative holding area where the aforementioned topical anesthesia was applied to the left eye and then brought back to the Operating Room where the left eye was prepped and draped in the usual sterile ophthalmic fashion and a lid speculum was placed. A paracentesis was created with the side port blade and the anterior chamber was filled with viscoelastic. A near clear corneal incision was performed with the steel keratome. A continuous curvilinear capsulorrhexis was performed with a cystotome followed by the capsulorrhexis forceps. Hydrodissection and hydrodelineation were carried out with BSS on a blunt cannula. The lens was removed in a stop and chop technique and the remaining cortical material was removed with the irrigation-aspiration handpiece. The capsular bag was inflated with viscoelastic and the Tecnis ZCB00 19.0-diopter lens, serial number [id number], was placed in the capsular bag without complication. The remaining viscoelastic was removed from the eye with the irrigation-aspiration handpiece. The wounds were hydrated. The anterior chamber was flushed with Miostat and the eye was inflated to physiologic pressure. 0.1 mL of cefuroxime concentration 10 mg/mL was placed in the anterior chamber. The wounds were found to be water  tight. The eye was dressed with Vigamox. The patient was given protective glasses to wear throughout the day and a shield with which to sleep tonight. The patient was also given drops with which to begin a drop regimen today and will follow-up with me in one day.    ____________________________ Lacey Jordan. Lacey Safley, MD wlp:jm D: 06/27/2013 17:18:54 ET T: 06/27/2013 19:34:44 ET JOB#: [id number]  cc: Thelmer Legler L. Jorgina Binning, MD, <Dictator> Lacey Jordan Cleophas Yoak MD ELECTRONICALLY SIGNED 06/29/2013 15:31

## 2015-03-15 NOTE — Op Note (Signed)
PATIENT NAME:  Lacey Jordan, Lacey Jordan MR#:  [id number] DATE OF BIRTH:  27-Mar-1951  DATE OF PROCEDURE:  07/25/2013  PREOPERATIVE DIAGNOSIS: Visually significant cataract of the right eye.   POSTOPERATIVE DIAGNOSIS: Visually significant cataract of the right eye.   OPERATIVE PROCEDURE: Cataract extraction by phacoemulsification with implant of intraocular lens to right eye.   SURGEON: Birder Robson, MD.   ANESTHESIA:  1. Managed anesthesia care.  2. Topical tetracaine drops followed by 2% Xylocaine jelly applied in the preoperative holding area.   COMPLICATIONS: None.   TECHNIQUE:  Stop and chop.  DESCRIPTION OF PROCEDURE: The patient was examined and consented in the preoperative holding area where the aforementioned topical anesthesia was applied to the right eye and then brought back to the Operating Room where the right eye was prepped and draped in the usual sterile ophthalmic fashion and a lid speculum was placed. A paracentesis was created with the side port blade and the anterior chamber was filled with viscoelastic. A near clear corneal incision was performed with the steel keratome. A continuous curvilinear capsulorrhexis was performed with a cystotome followed by the capsulorrhexis forceps. Hydrodissection and hydrodelineation were carried out with BSS on a blunt cannula. The lens was removed in a stop and chop technique and the remaining cortical material was removed with the irrigation-aspiration handpiece. The capsular bag was inflated with viscoelastic and the Tecnis ZCB00 19.5-diopter lens, serial number [id number] was placed in the capsular bag without complication. The remaining viscoelastic was removed from the eye with the irrigation-aspiration handpiece. The wounds were hydrated. The anterior chamber was flushed with Miostat and the eye was inflated to physiologic pressure. 0.1 mL of cefuroxime concentration 10 mg/mL was placed in the anterior chamber. The wounds were found to be  water tight. The eye was dressed with Vigamox. The patient was given protective glasses to wear throughout the day and a shield with which to sleep tonight. The patient was also given drops with which to begin a drop regimen today and will follow-up with me in one day.     ____________________________ Livingston Diones. Ulas Zuercher, MD wlp:dmm D: 07/25/2013 22:09:48 ET T: 07/25/2013 22:57:37 ET JOB#: [id number]  cc: Brison Fiumara L. Elian Gloster, MD, <Dictator> Livingston Diones Damon Hargrove MD ELECTRONICALLY SIGNED 07/26/2013 11:50

## 2015-03-18 LAB — SURGICAL PATHOLOGY

## 2015-04-02 ENCOUNTER — Other Ambulatory Visit: Payer: Self-pay | Admitting: General Surgery

## 2015-05-30 ENCOUNTER — Other Ambulatory Visit: Payer: Self-pay

## 2015-05-30 DIAGNOSIS — C50511 Malignant neoplasm of lower-outer quadrant of right female breast: Secondary | ICD-10-CM

## 2015-07-02 ENCOUNTER — Ambulatory Visit: Payer: Self-pay

## 2015-07-02 ENCOUNTER — Other Ambulatory Visit: Payer: Self-pay | Admitting: General Surgery

## 2015-07-02 ENCOUNTER — Ambulatory Visit
Admission: RE | Admit: 2015-07-02 | Discharge: 2015-07-02 | Disposition: A | Discharge status: Home / Self Care | Payer: Managed Care, Other (non HMO) | Source: Ambulatory Visit | Attending: General Surgery | Admitting: General Surgery

## 2015-07-02 DIAGNOSIS — C50511 Malignant neoplasm of lower-outer quadrant of right female breast: Secondary | ICD-10-CM

## 2015-07-02 DIAGNOSIS — Z853 Personal history of malignant neoplasm of breast: Secondary | ICD-10-CM | POA: Insufficient documentation

## 2015-07-02 HISTORY — DX: Malignant neoplasm of unspecified site of unspecified female breast: C50.919

## 2015-07-10 ENCOUNTER — Encounter: Payer: Self-pay | Admitting: General Surgery

## 2015-07-10 ENCOUNTER — Ambulatory Visit (INDEPENDENT_AMBULATORY_CARE_PROVIDER_SITE_OTHER): Payer: Managed Care, Other (non HMO) | Admitting: General Surgery

## 2015-07-10 VITALS — BP 134/82 | HR 84 | Resp 14 | Ht 65.5 in | Wt 160.0 lb

## 2015-07-10 DIAGNOSIS — C50411 Malignant neoplasm of upper-outer quadrant of right female breast: Secondary | ICD-10-CM

## 2015-07-10 NOTE — Patient Instructions (Signed)
Recommended to try Mederma cream as needed for comfort The patient has been asked to return to the office in one year with a bilateral left breast diagnostic mammogram. Continue self breast exams. Call office for any new breast issues or concerns.

## 2015-07-10 NOTE — Progress Notes (Signed)
Patient ID: Lacey Jordan, female   DOB: 1951/04/03, 64 y.o.   MRN: [id number]  Chief Complaint  Patient presents with  . Follow-up    mammogram    HPI Lacey Jordan is a 64 y.o. female.  who presents for her follow up breast cancer and breast evaluation. The most recent mammogram was done on 07/02/15.  Patient does perform regular self breast checks and gets regular mammograms done.  No new breast issues. 5 years post lumpectomy and mammosite radiation for breast cancer.  HPI  Past Medical History  Diagnosis Date  . Arthritis 2009  . Cancer 2011    right breast, L/SN/R stage 1 cancer ER/PR pos HER 2 negative  . Ulcer 1969    stomach  . Special screening for malignant neoplasms, colon   . Wrist fracture     age of 89  . Colitis 1980  . Malignant neoplasm of lower-outer quadrant of female breast 2011  . Bronchitis   . Breast cancer 2011    right breast with mammosite    Past Surgical History  Procedure Laterality Date  . Colonoscopy  2009  . Breast mammosite Right 2011  . Breast surgery Right 2011    stereo  . Breast lumpectomy with sentinel lymph node biopsy Right 2011  . Fracture surgery Left 2009  . Tubal ligation  1986  . Tonsillectomy      age of 62 yrs  . Breast biopsy Right     age of 80  . Breast excisional biopsy Right 2011    positive  . Breast biopsy Right 07/09/2014    negative    Family History  Problem Relation Age of Onset  . Breast cancer Paternal Aunt 58  . Breast cancer Cousin   . Breast cancer Cousin     Social History Social History  Substance Use Topics  . Smoking status: Former Smoker -- 1.00 packs/day for 20 years    Types: Cigarettes  . Smokeless tobacco: Never Used     Comment: quit in 2002  . Alcohol Use: Yes     Comment: drinks wine/once per month    Allergies  Allergen Reactions  . Adhesive [Tape] Rash    Band aid sensitivity  . Nickel Rash    Current Outpatient Prescriptions  Medication Sig Dispense Refill  .  letrozole (FEMARA) 2.5 MG tablet TAKE 1 TABLET DAILY 90 tablet 3  . meloxicam (MOBIC) 15 MG tablet Take 15 mg by mouth as needed for pain.     No current facility-administered medications for this visit.    Review of Systems Review of Systems  Constitutional: Negative.   Respiratory: Negative.   Cardiovascular: Negative.     Blood pressure 134/82, pulse 84, resp. rate 14, height 5' 5.5" (1.664 m), weight 160 lb (72.576 kg).  Physical Exam Physical Exam  Constitutional: She is oriented to person, place, and time. She appears well-developed and well-nourished.  HENT:  Mouth/Throat: Oropharynx is clear and moist.  Eyes: Conjunctivae are normal. No scleral icterus.  Neck: Neck supple.  Cardiovascular: Normal rate, regular rhythm and normal heart sounds.   Pulmonary/Chest: Effort normal and breath sounds normal. Right breast exhibits no inverted nipple, no mass, no nipple discharge, no skin change and no tenderness. Left breast exhibits no inverted nipple, no mass, no nipple discharge, no skin change and no tenderness.    Abdominal: Soft. Bowel sounds are normal.  Lymphadenopathy:    She has no cervical adenopathy.  Neurological: She is alert and oriented  to person, place, and time.  Skin: Skin is warm and dry.  Psychiatric: Her behavior is normal.    Data Reviewed Mammogram reviewed and stable.   Assessment    Stable exam. Pt is 5 yrs out from her initial cancer treatment-L/SN/R, and is continued on Letrazole    Plan    Recommended to try Mederma cream as needed for comfort. Patient to have a bilateral diagnostic mammogram follow up in one year.    PCP:  Otho Darner 07/10/2015, 4:12 PM

## 2016-04-27 ENCOUNTER — Other Ambulatory Visit: Payer: Self-pay

## 2016-04-27 DIAGNOSIS — C50511 Malignant neoplasm of lower-outer quadrant of right female breast: Secondary | ICD-10-CM

## 2016-05-12 ENCOUNTER — Encounter: Payer: Self-pay | Admitting: General Surgery

## 2016-05-15 ENCOUNTER — Encounter: Payer: Self-pay | Admitting: Family Medicine

## 2016-05-15 ENCOUNTER — Encounter: Payer: Self-pay | Admitting: General Surgery

## 2016-05-15 DIAGNOSIS — Z853 Personal history of malignant neoplasm of breast: Secondary | ICD-10-CM

## 2016-05-15 NOTE — Telephone Encounter (Unsigned)
Referral added.   Thanks,   -Mickel Baas

## 2016-05-18 ENCOUNTER — Encounter: Payer: Self-pay | Admitting: Family Medicine

## 2016-05-19 ENCOUNTER — Encounter: Payer: Self-pay | Admitting: Family Medicine

## 2016-05-20 ENCOUNTER — Telehealth: Payer: Self-pay

## 2016-05-20 DIAGNOSIS — C50511 Malignant neoplasm of lower-outer quadrant of right female breast: Secondary | ICD-10-CM

## 2016-05-20 DIAGNOSIS — Z853 Personal history of malignant neoplasm of breast: Secondary | ICD-10-CM

## 2016-05-20 DIAGNOSIS — K529 Noninfective gastroenteritis and colitis, unspecified: Secondary | ICD-10-CM

## 2016-05-20 NOTE — Telephone Encounter (Signed)
Ok to put in referral.  Thanks.

## 2016-05-20 NOTE — Telephone Encounter (Signed)
Patient is requesting a referral for mammogram and to Dr. Jamal Collin. She reports that she [employer] is requesting referrals. Patient already has an appt with Dr. Jamal Collin on 07/08/16 And mammogram on 07/03/16. sd

## 2016-07-03 ENCOUNTER — Ambulatory Visit
Admission: RE | Admit: 2016-07-03 | Discharge: 2016-07-03 | Disposition: A | Discharge status: Home / Self Care | Payer: Commercial Managed Care - HMO | Source: Ambulatory Visit | Attending: General Surgery | Admitting: General Surgery

## 2016-07-03 ENCOUNTER — Other Ambulatory Visit: Payer: Self-pay | Admitting: General Surgery

## 2016-07-03 ENCOUNTER — Ambulatory Visit: Admission: RE | Admit: 2016-07-03 | Payer: Commercial Managed Care - HMO | Source: Ambulatory Visit

## 2016-07-03 DIAGNOSIS — C50511 Malignant neoplasm of lower-outer quadrant of right female breast: Secondary | ICD-10-CM

## 2016-07-03 DIAGNOSIS — R928 Other abnormal and inconclusive findings on diagnostic imaging of breast: Secondary | ICD-10-CM | POA: Diagnosis not present

## 2016-07-03 DIAGNOSIS — Z1231 Encounter for screening mammogram for malignant neoplasm of breast: Secondary | ICD-10-CM | POA: Insufficient documentation

## 2016-07-08 ENCOUNTER — Ambulatory Visit (INDEPENDENT_AMBULATORY_CARE_PROVIDER_SITE_OTHER): Payer: Commercial Managed Care - HMO | Admitting: General Surgery

## 2016-07-08 ENCOUNTER — Encounter: Payer: Self-pay | Admitting: General Surgery

## 2016-07-08 VITALS — BP 154/78 | HR 76 | Resp 12 | Ht 65.5 in | Wt 152.0 lb

## 2016-07-08 DIAGNOSIS — Z853 Personal history of malignant neoplasm of breast: Secondary | ICD-10-CM

## 2016-07-08 MED ORDER — LETROZOLE 2.5 MG PO TABS: 2.5000 mg | ORAL_TABLET | Freq: Every day | ORAL | 3 refills | Status: DC

## 2016-07-08 NOTE — Patient Instructions (Addendum)
The patient is aware to call back for any questions or concerns.   Patient to return in one year for a diagnostic mammogram

## 2016-07-08 NOTE — Progress Notes (Signed)
Patient ID: Lacey Jordan, female   DOB: [birth year], 65 y.o.   MRN: [id number]  Chief Complaint  Patient presents with  . Follow-up    mammogram    HPI Lacey Jordan is a 65 y.o. female who presents for a breast evaluation. She has a personal history of right breast cancer, stage I, ER/PR (+), HER 2 (-). The most recent mammogram was done on 07-03-16. She had to stop the letrozole because her husband's company changed insurance plans and did not cover her, but she is amenable to restarting the drug if needed now that she is on medicare. Patient does perform regular self breast checks and gets regular mammograms done.  She has no new breast issues. I have reviewed the history of present illness with the patient.  HPI  Past Medical History:  Diagnosis Date  . Arthritis 2009  . Breast cancer [facility]) 2011   right breast with mammosite  . Bronchitis   . Cancer [facility]) 2011   right breast, L/SN/R stage 1 cancer ER/PR pos HER 2 negative  . Colitis 1980  . Malignant neoplasm of lower-outer quadrant of female breast ([place]) 2011  . Special screening for malignant neoplasms, colon   . Ulcer 1969   stomach  . Wrist fracture    age of 65    Past Surgical History:  Procedure Laterality Date  . BREAST BIOPSY Right 07/09/2014   negative  . BREAST EXCISIONAL BIOPSY Right    age of 70 - neg  . BREAST EXCISIONAL BIOPSY Right 2011   positive  . BREAST LUMPECTOMY WITH SENTINEL LYMPH NODE BIOPSY Right 2011  . BREAST MAMMOSITE Right 2011  . BREAST SURGERY Right 2011   stereo  . COLONOSCOPY  2009  . FRACTURE SURGERY Left 2009  . TONSILLECTOMY     age of 63 yrs  . TUBAL LIGATION  1986    Family History  Problem Relation Age of Onset  . Breast cancer Paternal Aunt 17  . Breast cancer Cousin   . Breast cancer Cousin     Social History Social History  Substance Use Topics  . Smoking status: Former Smoker    Packs/day: 1.00    Years: 20.00    Types: Cigarettes  . Smokeless tobacco:  Never Used     Comment: quit in 2002  . Alcohol use Yes     Comment: drinks wine/once per month    Allergies  Allergen Reactions  . Adhesive [Tape] Rash    Band aid sensitivity  . Nickel Rash    Current Outpatient Prescriptions  Medication Sig Dispense Refill  . Cholecalciferol (VITAMIN D) 2000 units tablet Take 2,000 Units by mouth daily.    Marland Kitchen ibuprofen (ADVIL,MOTRIN) 200 MG tablet Take 200 mg by mouth every 6 (six) hours as needed.    . Multiple Vitamin (MULTIVITAMIN) tablet Take 1 tablet by mouth daily.    Marland Kitchen letrozole (FEMARA) 2.5 MG tablet Take 1 tablet (2.5 mg total) by mouth daily. 90 tablet 3   No current facility-administered medications for this visit.     Review of Systems Review of Systems  Constitutional: Negative.   Respiratory: Negative.   Cardiovascular: Negative.     Blood pressure (!) 154/78, pulse 76, resp. rate 12, height 5' 5.5" (1.664 m), weight 152 lb (68.9 kg).  Physical Exam Physical Exam  Constitutional: She is oriented to person, place, and time. She appears well-developed and well-nourished.  HENT:  Mouth/Throat: Oropharynx is clear and moist.  Eyes: Conjunctivae are normal.  No scleral icterus.  Neck: Neck supple.  Cardiovascular: Normal rate, regular rhythm and normal heart sounds.   Pulmonary/Chest: Effort normal and breath sounds normal. Right breast exhibits tenderness. Right breast exhibits no inverted nipple, no mass, no nipple discharge and no skin change. Left breast exhibits no inverted nipple, no mass, no nipple discharge, no skin change and no tenderness.    Abdominal: Soft.  Lymphadenopathy:    She has no cervical adenopathy.    She has no axillary adenopathy.  Neurological: She is alert and oriented to person, place, and time.  Skin: Skin is warm and dry.  Psychiatric: Her behavior is normal.    Data Reviewed Prior notes. mammogram  Assessment    Personal history of right breast cancer, stage I, ER/PR pos HER 2  negative   Plan    Letrozole prescription sent to the pharmacy Patient to return in one year with a diagnostic mammogram     This information has been scribed by Karie Fetch RN, BSN,BC.   SANKAR,SEEPLAPUTHUR G 07/08/2016, 1:55 PM

## 2016-07-13 ENCOUNTER — Encounter: Payer: Self-pay | Admitting: General Surgery

## 2017-05-06 ENCOUNTER — Other Ambulatory Visit: Payer: Self-pay

## 2017-05-06 DIAGNOSIS — C50411 Malignant neoplasm of upper-outer quadrant of right female breast: Secondary | ICD-10-CM

## 2017-05-06 DIAGNOSIS — Z170 Estrogen receptor positive status [ER+]: Principal | ICD-10-CM

## 2017-07-05 ENCOUNTER — Ambulatory Visit
Admission: RE | Admit: 2017-07-05 | Discharge: 2017-07-05 | Disposition: A | Discharge status: Home / Self Care | Payer: Commercial Managed Care - HMO | Source: Ambulatory Visit | Attending: General Surgery | Admitting: General Surgery

## 2017-07-05 DIAGNOSIS — Z08 Encounter for follow-up examination after completed treatment for malignant neoplasm: Secondary | ICD-10-CM | POA: Diagnosis not present

## 2017-07-05 DIAGNOSIS — C50411 Malignant neoplasm of upper-outer quadrant of right female breast: Secondary | ICD-10-CM

## 2017-07-05 DIAGNOSIS — Z170 Estrogen receptor positive status [ER+]: Secondary | ICD-10-CM

## 2017-07-05 DIAGNOSIS — R928 Other abnormal and inconclusive findings on diagnostic imaging of breast: Secondary | ICD-10-CM | POA: Diagnosis not present

## 2017-07-05 DIAGNOSIS — Z853 Personal history of malignant neoplasm of breast: Secondary | ICD-10-CM | POA: Insufficient documentation

## 2017-07-14 ENCOUNTER — Ambulatory Visit (INDEPENDENT_AMBULATORY_CARE_PROVIDER_SITE_OTHER): Payer: Medicare HMO | Admitting: General Surgery

## 2017-07-14 ENCOUNTER — Encounter: Payer: Self-pay | Admitting: General Surgery

## 2017-07-14 VITALS — BP 138/84 | HR 82 | Resp 12 | Ht 65.5 in | Wt 153.0 lb

## 2017-07-14 DIAGNOSIS — Z853 Personal history of malignant neoplasm of breast: Secondary | ICD-10-CM | POA: Diagnosis not present

## 2017-07-14 NOTE — Patient Instructions (Addendum)
The patient is aware to call back for any questions or concerns.    Patient will be asked to return to the office in one year with a bilateral screening mammogram with Dr Byrnett 

## 2017-07-14 NOTE — Progress Notes (Signed)
Patient ID: Maxi Carreras, female   DOB: [birth year], 66 y.o.   MRN: [id number]  Chief Complaint  Patient presents with  . Follow-up    mammogram    HPI Belvia Gotschall is a 66 y.o. female.  who presents for her follow up breast cancer and a breast evaluation. The most recent mammogram was done on 07-05-17 .  Patient does perform regular self breast checks and gets regular mammograms done.  She still has random "stinging" pain right breast that is unchanged.  She stopped the letrozole at the end of last year, just didn't want to take this any longer.  HPI  Past Medical History:  Diagnosis Date  . Arthritis 2009  . Breast cancer [facility]) 2011   right breast with mammosite  . Bronchitis   . Cancer [facility]) 2011   right breast, L/SN/R stage 1 cancer ER/PR pos HER 2 negative  . Colitis 1980  . Malignant neoplasm of lower-outer quadrant of female breast ([place]) 2011  . Special screening for malignant neoplasms, colon   . Ulcer 1969   stomach  . Wrist fracture    age of 27    Past Surgical History:  Procedure Laterality Date  . BREAST BIOPSY Right 2011   invasive mammary carcinoma  . BREAST BIOPSY Right 07/09/2014   fat necrosis  . BREAST EXCISIONAL BIOPSY Right    age of 34 - neg  . BREAST LUMPECTOMY Right 2011   right breast invasive mammary carcinoma  . BREAST LUMPECTOMY WITH SENTINEL LYMPH NODE BIOPSY Right 2011  . BREAST MAMMOSITE Right 2011  . BREAST SURGERY Right 2011   stereo  . COLONOSCOPY  2009  . FRACTURE SURGERY Left 2009  . TONSILLECTOMY     age of 32 yrs  . TUBAL LIGATION  1986    Family History  Problem Relation Age of Onset  . Breast cancer Paternal Aunt 20  . Breast cancer Cousin   . Breast cancer Cousin     Social History Social History  Substance Use Topics  . Smoking status: Former Smoker    Packs/day: 1.00    Years: 20.00    Types: Cigarettes  . Smokeless tobacco: Never Used     Comment: quit in 2002  . Alcohol use Yes     Comment: drinks  wine/once per month    Allergies  Allergen Reactions  . Adhesive [Tape] Rash    Band aid sensitivity  . Nickel Rash    Current Outpatient Prescriptions  Medication Sig Dispense Refill  . Cholecalciferol (VITAMIN D) 2000 units tablet Take 2,000 Units by mouth daily.    Marland Kitchen ibuprofen (ADVIL,MOTRIN) 200 MG tablet Take 200 mg by mouth every 6 (six) hours as needed.    . Multiple Vitamin (MULTIVITAMIN) tablet Take 1 tablet by mouth daily.     No current facility-administered medications for this visit.     Review of Systems Review of Systems  Constitutional: Negative.   Respiratory: Negative.   Cardiovascular: Negative.     Blood pressure 138/84, pulse 82, resp. rate 12, height 5' 5.5" (1.664 m), weight 153 lb (69.4 kg).  Physical Exam Physical Exam  Constitutional: She is oriented to person, place, and time. She appears well-developed and well-nourished.  HENT:  Mouth/Throat: Oropharynx is clear and moist.  Eyes: Conjunctivae are normal. No scleral icterus.  Neck: Neck supple.  Cardiovascular: Normal rate, regular rhythm and normal heart sounds.   Pulmonary/Chest: Effort normal and breath sounds normal. Right breast exhibits tenderness. Right breast exhibits  no inverted nipple, no mass, no nipple discharge and no skin change. Left breast exhibits no inverted nipple, no mass, no nipple discharge, no skin change and no tenderness.    Abdominal: Soft. There is no tenderness.  Lymphadenopathy:    She has no cervical adenopathy.    She has no axillary adenopathy.  Neurological: She is alert and oriented to person, place, and time.  Skin: Skin is warm and dry.  Psychiatric: Her behavior is normal.    Data Reviewed  Mammogram reviewed  Assessment    She is 7 years post right breast cancer, stage I, ER/PR (+), HER 2 (-).She has completed the little over 5 years of letrozole. She does not wish to continue this any further. Exam is stable    Plan    Patient will be asked to  return to the office in one year with a bilateral screening mammogram with Dr Bary Castilla      HPI, Physical Exam, Assessment and Plan have been scribed under the direction and in the presence of Mckinley Jewel, MD Karie Fetch, RN I have completed the exam and reviewed the above documentation for accuracy and completeness.  I agree with the above.  Haematologist has been used and any errors in dictation or transcription are unintentional.  Maressa Apollo G. Jamal Collin, M.D., F.A.C.S.   Junie Panning G 07/16/2017, 9:00 AM

## 2017-08-26 ENCOUNTER — Ambulatory Visit (INDEPENDENT_AMBULATORY_CARE_PROVIDER_SITE_OTHER): Payer: Medicare HMO

## 2017-08-26 VITALS — BP 168/92 | HR 88 | Temp 98.8°F | Ht 66.0 in | Wt 156.8 lb

## 2017-08-26 DIAGNOSIS — Z0000 Encounter for general adult medical examination without abnormal findings: Secondary | ICD-10-CM | POA: Diagnosis not present

## 2017-08-26 DIAGNOSIS — Z23 Encounter for immunization: Secondary | ICD-10-CM | POA: Diagnosis not present

## 2017-08-26 NOTE — Patient Instructions (Signed)
Lacey Jordan , Thank you for taking time to come for your Medicare Wellness Visit. I appreciate your ongoing commitment to your health goals. Please review the following plan we discussed and let me know if I can assist you in the future.   Screening recommendations/referrals: Colonoscopy: up to date Mammogram: up to date Bone Density: up to date Recommended yearly ophthalmology/optometry visit for glaucoma screening and checkup Recommended yearly dental visit for hygiene and checkup  Vaccinations: Influenza vaccine: completed today Pneumococcal vaccine: Prevnar 13 completed today Tdap vaccine: up to date Shingles vaccine: declined  Advanced directives: Please bring a copy of your POA (Power of Attorney) and/or Living Will to your next appointment.   Conditions/risks identified: Recommend increasing water intake to 3 glasses a day.   Next appointment: 10/07/17 @ 9:00 AM   Preventive Care 66 Years and Older, Female Preventive care refers to lifestyle choices and visits with your health care provider that can promote health and wellness. What does preventive care include?  A yearly physical exam. This is also called an annual well check.  Dental exams once or twice a year.  Routine eye exams. Ask your health care provider how often you should have your eyes checked.  Personal lifestyle choices, including:  Daily care of your teeth and gums.  Regular physical activity.  Eating a healthy diet.  Avoiding tobacco and drug use.  Limiting alcohol use.  Practicing safe sex.  Taking low-dose aspirin every day.  Taking vitamin and mineral supplements as recommended by your health care provider. What happens during an annual well check? The services and screenings done by your health care provider during your annual well check will depend on your age, overall health, lifestyle risk factors, and family history of disease. Counseling  Your health care provider may ask you  questions about your:  Alcohol use.  Tobacco use.  Drug use.  Emotional well-being.  Home and relationship well-being.  Sexual activity.  Eating habits.  History of falls.  Memory and ability to understand (cognition).  Work and work Statistician.  Reproductive health. Screening  You may have the following tests or measurements:  Height, weight, and BMI.  Blood pressure.  Lipid and cholesterol levels. These may be checked every 5 years, or more frequently if you are over 37 years old.  Skin check.  Lung cancer screening. You may have this screening every year starting at age 3 if you have a 30-pack-year history of smoking and currently smoke or have quit within the past 15 years.  Fecal occult blood test (FOBT) of the stool. You may have this test every year starting at age 40.  Flexible sigmoidoscopy or colonoscopy. You may have a sigmoidoscopy every 5 years or a colonoscopy every 10 years starting at age 42.  Hepatitis C blood test.  Hepatitis B blood test.  Sexually transmitted disease (STD) testing.  Diabetes screening. This is done by checking your blood sugar (glucose) after you have not eaten for a while (fasting). You may have this done every 1-3 years.  Bone density scan. This is done to screen for osteoporosis. You may have this done starting at age 79.  Mammogram. This may be done every 1-2 years. Talk to your health care provider about how often you should have regular mammograms. Talk with your health care provider about your test results, treatment options, and if necessary, the need for more tests. Vaccines  Your health care provider may recommend certain vaccines, such as:  Influenza vaccine. This  is recommended every year.  Tetanus, diphtheria, and acellular pertussis (Tdap, Td) vaccine. You may need a Td booster every 10 years.  Zoster vaccine. You may need this after age 43.  Pneumococcal 13-valent conjugate (PCV13) vaccine. One dose is  recommended after age 69.  Pneumococcal polysaccharide (PPSV23) vaccine. One dose is recommended after age 58. Talk to your health care provider about which screenings and vaccines you need and how often you need them. This information is not intended to replace advice given to you by your health care provider. Make sure you discuss any questions you have with your health care provider. Document Released: 12/06/2015 Document Revised: 07/29/2016 Document Reviewed: 09/10/2015 Elsevier Interactive Patient Education  2017 [place] Prevention in the Home Falls can cause injuries. They can happen to people of all ages. There are many things you can do to make your home safe and to help prevent falls. What can I do on the outside of my home?  Regularly fix the edges of walkways and driveways and fix any cracks.  Remove anything that might make you trip as you walk through a door, such as a raised step or threshold.  Trim any bushes or trees on the path to your home.  Use bright outdoor lighting.  Clear any walking paths of anything that might make someone trip, such as rocks or tools.  Regularly check to see if handrails are loose or broken. Make sure that both sides of any steps have handrails.  Any raised decks and porches should have guardrails on the edges.  Have any leaves, snow, or ice cleared regularly.  Use sand or salt on walking paths during winter.  Clean up any spills in your garage right away. This includes oil or grease spills. What can I do in the bathroom?  Use night lights.  Install grab bars by the toilet and in the tub and shower. Do not use towel bars as grab bars.  Use non-skid mats or decals in the tub or shower.  If you need to sit down in the shower, use a plastic, non-slip stool.  Keep the floor dry. Clean up any water that spills on the floor as soon as it happens.  Remove soap buildup in the tub or shower regularly.  Attach bath mats  securely with double-sided non-slip rug tape.  Do not have throw rugs and other things on the floor that can make you trip. What can I do in the bedroom?  Use night lights.  Make sure that you have a light by your bed that is easy to reach.  Do not use any sheets or blankets that are too big for your bed. They should not hang down onto the floor.  Have a firm chair that has side arms. You can use this for support while you get dressed.  Do not have throw rugs and other things on the floor that can make you trip. What can I do in the kitchen?  Clean up any spills right away.  Avoid walking on wet floors.  Keep items that you use a lot in easy-to-reach places.  If you need to reach something above you, use a strong step stool that has a grab bar.  Keep electrical cords out of the way.  Do not use floor polish or wax that makes floors slippery. If you must use wax, use non-skid floor wax.  Do not have throw rugs and other things on the floor that can make you  trip. What can I do with my stairs?  Do not leave any items on the stairs.  Make sure that there are handrails on both sides of the stairs and use them. Fix handrails that are broken or loose. Make sure that handrails are as long as the stairways.  Check any carpeting to make sure that it is firmly attached to the stairs. Fix any carpet that is loose or worn.  Avoid having throw rugs at the top or bottom of the stairs. If you do have throw rugs, attach them to the floor with carpet tape.  Make sure that you have a light switch at the top of the stairs and the bottom of the stairs. If you do not have them, ask someone to add them for you. What else can I do to help prevent falls?  Wear shoes that:  Do not have high heels.  Have rubber bottoms.  Are comfortable and fit you well.  Are closed at the toe. Do not wear sandals.  If you use a stepladder:  Make sure that it is fully opened. Do not climb a closed  stepladder.  Make sure that both sides of the stepladder are locked into place.  Ask someone to hold it for you, if possible.  Clearly mark and make sure that you can see:  Any grab bars or handrails.  First and last steps.  Where the edge of each step is.  Use tools that help you move around (mobility aids) if they are needed. These include:  Canes.  Walkers.  Scooters.  Crutches.  Turn on the lights when you go into a dark area. Replace any light bulbs as soon as they burn out.  Set up your furniture so you have a clear path. Avoid moving your furniture around.  If any of your floors are uneven, fix them.  If there are any pets around you, be aware of where they are.  Review your medicines with your doctor. Some medicines can make you feel dizzy. This can increase your chance of falling. Ask your doctor what other things that you can do to help prevent falls. This information is not intended to replace advice given to you by your health care provider. Make sure you discuss any questions you have with your health care provider. Document Released: 09/05/2009 Document Revised: 04/16/2016 Document Reviewed: 12/14/2014 Elsevier Interactive Patient Education  2017 [employer].

## 2017-08-26 NOTE — Progress Notes (Signed)
Subjective:   Lacey Jordan is a 66 y.o. female who presents for an Initial Medicare Annual Wellness Visit.  Review of Systems    N/A  Cardiac Risk Factors include: advanced age (>82men, >84 women);dyslipidemia;hypertension     Objective:    Today's Vitals   08/26/17 1053 08/26/17 1101  BP: (!) 162/98 (!) 168/92  Pulse: 88   Temp: 98.8 F (37.1 C)   TempSrc: Oral   Weight: 156 lb 12.8 oz (71.1 kg)   Height: 5\' 6"  (1.676 m)   PainSc: 0-No pain 0-No pain   Body mass index is 25.31 kg/m.   Current Medications (verified) Outpatient Encounter Prescriptions as of 08/26/2017  Medication Sig  . ibuprofen (ADVIL,MOTRIN) 200 MG tablet Take 200 mg by mouth every 6 (six) hours as needed.  . Multiple Vitamin (MULTIVITAMIN) tablet Take 1 tablet by mouth daily.   . [DISCONTINUED] Cholecalciferol (VITAMIN D) 2000 units tablet Take 2,000 Units by mouth daily.   No facility-administered encounter medications on file as of 08/26/2017.     Allergies (verified) Adhesive [tape] and Nickel   History: Past Medical History:  Diagnosis Date  . Arthritis 2009  . Breast cancer [facility]) 2011   right breast with mammosite  . Bronchitis   . Cancer [facility]) 2011   right breast, L/SN/R stage 1 cancer ER/PR pos HER 2 negative  . Colitis 1980  . Malignant neoplasm of lower-outer quadrant of female breast ([place]) 2011  . Special screening for malignant neoplasms, colon   . Ulcer 1969   stomach  . Wrist fracture    age of 73   Past Surgical History:  Procedure Laterality Date  . BREAST BIOPSY Right 2011   invasive mammary carcinoma  . BREAST BIOPSY Right 07/09/2014   fat necrosis  . BREAST EXCISIONAL BIOPSY Right    age of 34 - neg  . BREAST LUMPECTOMY Right 2011   right breast invasive mammary carcinoma  . BREAST LUMPECTOMY WITH SENTINEL LYMPH NODE BIOPSY Right 2011  . BREAST MAMMOSITE Right 2011  . BREAST SURGERY Right 2011   stereo  . COLONOSCOPY  2009  . FRACTURE SURGERY Left 2009    . TONSILLECTOMY     age of 21 yrs  . TUBAL LIGATION  1986   Family History  Problem Relation Age of Onset  . Breast cancer Paternal Aunt 27  . Breast cancer Cousin   . Breast cancer Cousin   . Diabetes Mother    Social History   Occupational History  . Not on file.   Social History Main Topics  . Smoking status: Former Smoker    Packs/day: 1.00    Years: 20.00    Types: Cigarettes  . Smokeless tobacco: Never Used     Comment: quit in 2002  . Alcohol use Yes     Comment: wine- once per month  . Drug use: No  . Sexual activity: Not on file    Tobacco Counseling Counseling given: Not Answered   Activities of Daily Living In your present state of health, do you have any difficulty performing the following activities: 08/26/2017  Hearing? N  Vision? N  Difficulty concentrating or making decisions? N  Walking or climbing stairs? N  Dressing or bathing? N  Doing errands, shopping? N  Preparing Food and eating ? N  Using the Toilet? N  In the past six months, have you accidently leaked urine? Y  Comment occasionally- not enough to wear protection  Do you have problems with  loss of bowel control? N  Managing your Medications? N  Managing your Finances? N  Housekeeping or managing your Housekeeping? N  Some recent data might be hidden    Immunizations and Health Maintenance Immunization History  Administered Date(s) Administered  . Influenza, High Dose Seasonal PF 08/26/2017  . Pneumococcal Conjugate-13 08/26/2017  . Tdap 10/05/2011   Health Maintenance Due  Topic Date Due  . Hepatitis C Screening  1951/04/15    Patient Care Team: Birdie Sons, MD as PCP - General (Family Medicine)  Indicate any recent Medical Services you may have received from other than [facility] providers in the past year (date may be approximate).     Assessment:   This is a routine wellness examination for [facility].   Hearing/Vision screen Vision Screening Comments: Pt does not  have regular vision checks. No current vision complaints.   Dietary issues and exercise activities discussed: Current Exercise Habits: The patient does not participate in regular exercise at present, Exercise limited by: None identified  Goals    . Increase water intake          Recommend increasing water intake to 3 glasses a day.       Depression Screen PHQ 2/9 Scores 08/26/2017  PHQ - 2 Score 3  PHQ- 9 Score 4    Fall Risk Fall Risk  08/26/2017  Falls in the past year? No    Cognitive Function:      6CIT Screen 08/26/2017  What Year? 0 points  What month? 0 points  What time? 0 points  Count back from 20 0 points  Months in reverse 0 points  Repeat phrase 0 points  Total Score 0    Screening Tests Health Maintenance  Topic Date Due  . Hepatitis C Screening  04-Nov-1951  . DEXA SCAN  03/22/2018  . PNA vac Low Risk Adult (2 of 2 - PPSV23) 08/26/2018  . MAMMOGRAM  07/06/2019  . TETANUS/TDAP  10/04/2021  . COLONOSCOPY  01/22/2025  . INFLUENZA VACCINE  Completed      Plan:  I have personally reviewed and addressed the Medicare Annual Wellness questionnaire and have noted the following in the patient's chart:  A. Medical and social history B. Use of alcohol, tobacco or illicit drugs  C. Current medications and supplements D. Functional ability and status E.  Nutritional status F.  Physical activity G. Advance directives H. List of other physicians I.  Hospitalizations, surgeries, and ER visits in previous 12 months J.  [place] such as hearing and vision if needed, cognitive and depression L. Referrals and appointments - none  In addition, I have reviewed and discussed with patient certain preventive protocols, quality metrics, and best practice recommendations. A written personalized care plan for preventive services as well as general preventive health recommendations were provided to patient.  See attached scanned questionnaire for additional  information.   Signed,  Fabio Neighbors, LPN Nurse Health Advisor   MD Recommendations: Pt needs the Hepatitis C blood test completed at next OV. Pt declined this today.

## 2017-09-27 ENCOUNTER — Ambulatory Visit: Payer: Medicare HMO | Admitting: Physician Assistant

## 2017-09-27 ENCOUNTER — Encounter: Payer: Self-pay | Admitting: Physician Assistant

## 2017-09-27 VITALS — BP 122/78 | HR 88 | Temp 98.0°F | Resp 16 | Wt 153.0 lb

## 2017-09-27 DIAGNOSIS — H6692 Otitis media, unspecified, left ear: Secondary | ICD-10-CM

## 2017-09-27 DIAGNOSIS — H60502 Unspecified acute noninfective otitis externa, left ear: Secondary | ICD-10-CM

## 2017-09-27 MED ORDER — NEOMYCIN-COLIST-HC-THONZONIUM 3.3-3-10-0.5 MG/ML OT SUSP: 4.0000 [drp] | Freq: Four times a day (QID) | OTIC | 0 refills | Status: DC

## 2017-09-27 MED ORDER — AMOXICILLIN-POT CLAVULANATE 875-125 MG PO TABS
1.0000 | ORAL_TABLET | Freq: Two times a day (BID) | ORAL | 0 refills | Status: AC
Start: 2017-09-27 — End: 2017-10-04

## 2017-09-27 NOTE — Progress Notes (Signed)
Patient: Lacey Jordan Female    DOB: [birth year]   66 y.o.   MRN: [id number] Visit Date: 09/27/2017  Today's Provider: Trinna Post, PA-C   Chief Complaint  Patient presents with  . Ear Pain    Left ear started yesterday.  Pt reports having a cold six days ago.   Subjective:    Lacey Jordan is a 66 y/o woman with history of bilateral otitis media presenting today with left ear pain. Pertinent info listed below. Says she had severe ear problems many years ago, needed oral medications and also drops.    Otalgia   There is pain in the left ear. This is a new problem. The current episode started yesterday. The problem occurs constantly. The problem has been unchanged. There has been no fever. The pain is at a severity of 5/10. Associated symptoms include coughing, headaches, hearing loss and rhinorrhea. Pertinent negatives include no ear discharge or sore throat. Her past medical history is significant for a chronic ear infection.       Allergies  Allergen Reactions  . Adhesive [Tape] Rash    Band aid sensitivity  . Nickel Rash     Current Outpatient Medications:  .  ibuprofen (ADVIL,MOTRIN) 200 MG tablet, Take 200 mg by mouth every 6 (six) hours as needed., Disp: , Rfl:  .  Multiple Vitamin (MULTIVITAMIN) tablet, Take 1 tablet by mouth daily. , Disp: , Rfl:   Review of Systems  Constitutional: Positive for fatigue. Negative for activity change, appetite change, chills, diaphoresis, fever and unexpected weight change.  HENT: Positive for congestion, ear pain, hearing loss, postnasal drip, rhinorrhea and tinnitus. Negative for ear discharge, sinus pressure, sinus pain, sneezing, sore throat, trouble swallowing and voice change.   Respiratory: Positive for cough. Negative for apnea, choking, chest tightness, shortness of breath, wheezing and stridor.   Gastrointestinal: Negative.   Neurological: Positive for dizziness and headaches. Negative for light-headedness.     Social History   Tobacco Use  . Smoking status: Former Smoker    Packs/day: 1.00    Years: 20.00    Pack years: 20.00    Types: Cigarettes  . Smokeless tobacco: Never Used  . Tobacco comment: quit in 2002  Substance Use Topics  . Alcohol use: Yes    Comment: wine- once per month   Objective:   BP 122/78 (BP Location: Left Arm, Patient Position: Sitting, Cuff Size: Normal)   Pulse 88   Temp 98 F (36.7 C) (Oral)   Resp 16   Wt 153 lb (69.4 kg)   BMI 24.69 kg/m  Vitals:   09/27/17 1326  BP: 122/78  Pulse: 88  Resp: 16  Temp: 98 F (36.7 C)  TempSrc: Oral  Weight: 153 lb (69.4 kg)     Physical Exam  Constitutional: She appears well-developed and well-nourished.  HENT:  Right Ear: External ear normal.  Left Ear: External ear normal. Tympanic membrane is injected.  Mouth/Throat: Oropharynx is clear and moist. No oropharyngeal exudate.  Canal edematous, TM injected. No pain with manipulation of left ear.  Cardiovascular: Normal rate and regular rhythm.  Lymphadenopathy:    She has cervical adenopathy.        Assessment & Plan:     1. Left otitis media, unspecified otitis media type  - amoxicillin-clavulanate (AUGMENTIN) 875-125 MG tablet; Take 1 tablet 2 (two) times daily for 7 days by mouth.  Dispense: 14 tablet; Refill: 0  2. Acute otitis externa of left  ear, unspecified type  - neomycin-colistin-hydrocortisone-thonzonium (CORTISPORIN-TC) 3.01-23-09-0.5 MG/ML OTIC suspension; Place 4 drops 4 (four) times daily into the left ear.  Dispense: 10 mL; Refill: 0  Return if symptoms worsen or fail to improve.  The entirety of the information documented in the History of Present Illness, Review of Systems and Physical Exam were personally obtained by me. Portions of this information were initially documented by Ashley Royalty, CMA and reviewed by me for thoroughness and accuracy.        Trinna Post, PA-C  [facility]

## 2017-09-27 NOTE — Patient Instructions (Signed)
Otitis Media, Adult Otitis media is redness, soreness, and puffiness (swelling) in the space just behind your eardrum (middle ear). It may be caused by allergies or infection. It often happens along with a cold. Follow these instructions at home:  Take your medicine as told. Finish it even if you start to feel better.  Only take over-the-counter or prescription medicines for pain, discomfort, or fever as told by your doctor.  Follow up with your doctor as told. Contact a doctor if:  You have otitis media only in one ear, or bleeding from your nose, or both.  You notice a lump on your neck.  You are not getting better in 3-5 days.  You feel worse instead of better. Get help right away if:  You have pain that is not helped with medicine.  You have puffiness, redness, or pain around your ear.  You get a stiff neck.  You cannot move part of your face (paralysis).  You notice that the bone behind your ear hurts when you touch it. This information is not intended to replace advice given to you by your health care provider. Make sure you discuss any questions you have with your health care provider. Document Released: 04/27/2008 Document Revised: 04/16/2016 Document Reviewed: 06/06/2013 Elsevier Interactive Patient Education  2017 [employer]  

## 2017-09-29 ENCOUNTER — Ambulatory Visit: Payer: Medicare HMO | Admitting: Physician Assistant

## 2017-10-04 ENCOUNTER — Encounter: Payer: Self-pay | Admitting: Family Medicine

## 2017-10-07 ENCOUNTER — Encounter: Payer: Self-pay | Admitting: Family Medicine

## 2017-10-07 ENCOUNTER — Ambulatory Visit (INDEPENDENT_AMBULATORY_CARE_PROVIDER_SITE_OTHER): Payer: Medicare HMO | Admitting: Family Medicine

## 2017-10-07 VITALS — BP 116/80 | HR 72 | Temp 97.8°F | Resp 16 | Ht 66.0 in | Wt 154.0 lb

## 2017-10-07 DIAGNOSIS — H6122 Impacted cerumen, left ear: Secondary | ICD-10-CM | POA: Diagnosis not present

## 2017-10-07 DIAGNOSIS — Z0000 Encounter for general adult medical examination without abnormal findings: Secondary | ICD-10-CM | POA: Diagnosis not present

## 2017-10-07 DIAGNOSIS — M8580 Other specified disorders of bone density and structure, unspecified site: Secondary | ICD-10-CM | POA: Insufficient documentation

## 2017-10-07 DIAGNOSIS — F4321 Adjustment disorder with depressed mood: Secondary | ICD-10-CM | POA: Insufficient documentation

## 2017-10-07 DIAGNOSIS — F341 Dysthymic disorder: Secondary | ICD-10-CM

## 2017-10-07 DIAGNOSIS — M545 Low back pain, unspecified: Secondary | ICD-10-CM | POA: Insufficient documentation

## 2017-10-07 DIAGNOSIS — H9192 Unspecified hearing loss, left ear: Secondary | ICD-10-CM | POA: Diagnosis not present

## 2017-10-07 DIAGNOSIS — M8589 Other specified disorders of bone density and structure, multiple sites: Secondary | ICD-10-CM

## 2017-10-07 DIAGNOSIS — Z1159 Encounter for screening for other viral diseases: Secondary | ICD-10-CM

## 2017-10-07 LAB — POCT URINALYSIS DIPSTICK
BILIRUBIN UA: NEGATIVE
Glucose, UA: NEGATIVE
Ketones, UA: NEGATIVE
Leukocytes, UA: NEGATIVE
NITRITE UA: NEGATIVE
PH UA: 6 (ref 5.0–8.0)
Protein, UA: NEGATIVE
RBC UA: NEGATIVE
Spec Grav, UA: 1.02 (ref 1.010–1.025)
UROBILINOGEN UA: 0.2 U/dL

## 2017-10-07 NOTE — Progress Notes (Signed)
Patient: Lacey Jordan, Female    DOB: 1951-07-23, 66 y.o.   MRN: [id number] Visit Date: 10/07/2017  Today's Provider: Lavon Paganini, MD   Chief Complaint  Patient presents with  . Annual Exam   Subjective:    Annual physical exam Lacey Jordan is a 66 y.o. female who presents today for health maintenance and complete physical. She feels fairly well. She is c/o back pain, hearing loss in left ear, and left eye "twitching". She reports she is not exercising. She reports she is sleeping fairly well.  Had annual wellness visit with nurse health advisor on 08/26/2017. Last colonoscopy- 01/23/2015- hyperplastic polyps. Last mammogram- 07/05/2017- BI-RADS 2 Last BMD- 03/22/2013- osteopenia (Care Everywhere) - has not been taking Ca and Vit D supplements ----------------------------------------------------------------- Pt saw Adriana on 09/27/2017 for left otitis media. She was prescribed Augmentin and Cortisporin ear drops. She states pharmacy never filled ear drops due to cost. The pain was improving at that time, so pt was unconcerned. She states the next day, the hearing was gone in that ear.  Hearing is not completely gone.  Sounds muffled as if under water or in a tunnel.  No dizziness.  Can get better if lays on that side for a long time.  Worse with bending forward.   She is also c/o back pain. The pain has been present for 6 months, and is intermittent. She believes the pain is aggravated by "full bowels". Radiates to both sides.  States she fell on tailbone as a child and had trouble with that for a long time.  Chronic painful urination.  No rash.  Bending and lifting worsens pain.  Review of Systems  Constitutional: Negative.   HENT: Positive for hearing loss. Negative for congestion, dental problem, drooling, ear discharge, ear pain, facial swelling, mouth sores, nosebleeds, postnasal drip, rhinorrhea, sinus pressure, sinus pain, sneezing, sore throat, tinnitus,  trouble swallowing and voice change.   Eyes: Negative.   Respiratory: Negative.   Cardiovascular: Negative.   Gastrointestinal: Negative.   Genitourinary: Positive for dysuria and enuresis. Negative for decreased urine volume, difficulty urinating, dyspareunia, flank pain, frequency, genital sores, hematuria, menstrual problem, pelvic pain, urgency, vaginal bleeding, vaginal discharge and vaginal pain.  Musculoskeletal: Positive for back pain. Negative for arthralgias, gait problem, joint swelling, myalgias, neck pain and neck stiffness.  Skin: Negative.   Allergic/Immunologic: Negative.   Neurological: Negative.   Hematological: Negative for adenopathy. Bruises/bleeds easily.  Psychiatric/Behavioral: Positive for dysphoric mood. Negative for agitation, behavioral problems, confusion, decreased concentration, hallucinations, self-injury, sleep disturbance and suicidal ideas. The patient is not nervous/anxious and is not hyperactive.     Social History      She  reports that she quit smoking about 16 years ago. Her smoking use included cigarettes. She has a 20.00 pack-year smoking history. she has never used smokeless tobacco. She reports that she drinks alcohol. She reports that she does not use drugs.       Social History   Socioeconomic History  . Marital status: Married    Spouse name: Kyung Rudd  . Number of children: 2  . Years of education: 37  . Highest education level: None  Social Needs  . Financial resource strain: None  . Food insecurity - worry: None  . Food insecurity - inability: None  . Transportation needs - medical: None  . Transportation needs - non-medical: None  Occupational History  . Occupation: retired  Tobacco Use  . Smoking status: Former Smoker  Packs/day: 1.00    Years: 20.00    Pack years: 20.00    Types: Cigarettes    Last attempt to quit: 06/12/2001    Years since quitting: 16.3  . Smokeless tobacco: Never Used  . Tobacco comment: quit in 2002    Substance and Sexual Activity  . Alcohol use: Yes    Comment: wine- once per month  . Drug use: No  . Sexual activity: None  Other Topics Concern  . None  Social History Narrative  . None    Past Medical History:  Diagnosis Date  . Arthritis 2009  . Breast cancer [facility]) 2011   right breast with mammosite  . Bronchitis   . Cancer [facility]) 2011   right breast, L/SN/R stage 1 cancer ER/PR pos HER 2 negative  . Colitis 1980  . Malignant neoplasm of lower-outer quadrant of female breast ([place]) 2011  . Special screening for malignant neoplasms, colon   . Ulcer 1969   stomach  . Wrist fracture    age of 75     Patient Active Problem List   Diagnosis Date Noted  . Dysthymia 10/07/2017  . Osteopenia 10/07/2017  . Hearing decreased, left 10/07/2017  . Low back pain 10/07/2017  . History of breast cancer 07/18/2013  . Cancer [facility])     Past Surgical History:  Procedure Laterality Date  . BREAST BIOPSY Right 2011   invasive mammary carcinoma  . BREAST BIOPSY Right 07/09/2014   fat necrosis  . BREAST EXCISIONAL BIOPSY Right    age of 62 - neg  . BREAST LUMPECTOMY Right 2011   right breast invasive mammary carcinoma  . BREAST LUMPECTOMY WITH SENTINEL LYMPH NODE BIOPSY Right 2011  . BREAST MAMMOSITE Right 2011  . BREAST SURGERY Right 2011   stereo  . COLONOSCOPY  2009  . FRACTURE SURGERY Left 2009  . TONSILLECTOMY     age of 6 yrs  . TUBAL LIGATION  1986    Family History        Family Status  Relation Name Status  . Ethlyn Daniels  (Not Specified)  . Cousin  (Not Specified)  . Cousin  (Not Specified)  . Mother  Deceased  . Father  Deceased        Her family history includes Breast cancer in her cousin and cousin; Breast cancer (age of onset: 57) in her paternal aunt; Diabetes in her mother.     Allergies  Allergen Reactions  . Adhesive [Tape] Rash    Band aid sensitivity  . Nickel Rash     Current Outpatient Medications:  .  ibuprofen (ADVIL,MOTRIN) 200 MG  tablet, Take 200 mg by mouth every 6 (six) hours as needed., Disp: , Rfl:  .  Multiple Vitamin (MULTIVITAMIN) tablet, Take 1 tablet by mouth daily. , Disp: , Rfl:    Patient Care Team: Birdie Sons, MD as PCP - General (Family Medicine)      Objective:   Vitals: BP 116/80 (BP Location: Left Arm, Patient Position: Sitting, Cuff Size: Normal)   Pulse 72   Temp 97.8 F (36.6 C) (Oral)   Resp 16   Ht 5\' 6"  (1.676 m)   Wt 154 lb (69.9 kg)   BMI 24.86 kg/m    Vitals:   10/07/17 0906  BP: 116/80  Pulse: 72  Resp: 16  Temp: 97.8 F (36.6 C)  TempSrc: Oral  Weight: 154 lb (69.9 kg)  Height: 5\' 6"  (1.676 m)     Physical  Exam  Constitutional: She is oriented to person, place, and time. She appears well-developed and well-nourished. No distress.  HENT:  Head: Normocephalic and atraumatic.  Right Ear: Tympanic membrane, external ear and ear canal normal.  Left Ear: Tympanic membrane, external ear and ear canal normal.  Nose: Nose normal.  Mouth/Throat: Oropharynx is clear and moist.  +cerumen impaction of L ear, TM normal after removal of cerumen.  Eyes: Conjunctivae and EOM are normal. Pupils are equal, round, and reactive to light. No scleral icterus.  Neck: Neck supple. No thyromegaly present.  Cardiovascular: Normal rate, regular rhythm, normal heart sounds and intact distal pulses.  No murmur heard. Pulmonary/Chest: Breath sounds normal. No respiratory distress. She has no wheezes. She has no rales.  Abdominal: Soft. Bowel sounds are normal. She exhibits no distension. There is no tenderness. There is no rebound and no guarding.  Musculoskeletal: She exhibits no edema or deformity.  No back TTP. Painful to twist to each side.  Limited back extension.  Lymphadenopathy:    She has no cervical adenopathy.  Neurological: She is alert and oriented to person, place, and time. No cranial nerve deficit.  Skin: Skin is warm and dry. No rash noted.  Psychiatric: She has a normal  mood and affect. Her behavior is normal.  Vitals reviewed.    Depression Screen PHQ 2/9 Scores 10/07/2017 08/26/2017  PHQ - 2 Score 2 3  PHQ- 9 Score 7 4   Depression screen [facility] 2/9 10/07/2017 08/26/2017  Decreased Interest 1 0  Down, Depressed, Hopeless 1 3  PHQ - 2 Score 2 3  Altered sleeping 1 0  Tired, decreased energy 1 0  Change in appetite 0 0  Feeling bad or failure about yourself  1 0  Trouble concentrating 1 0  Moving slowly or fidgety/restless 0 0  Suicidal thoughts 1 1  PHQ-9 Score 7 4  Difficult doing work/chores Not difficult at all Not difficult at all    Results for orders placed or performed in visit on 10/07/17  POCT urinalysis dipstick  Result Value Ref Range   Color, UA yellow    Clarity, UA clear    Glucose, UA Negative    Bilirubin, UA Negative    Ketones, UA Negative    Spec Grav, UA 1.020 1.010 - 1.025   Blood, UA Negative    pH, UA 6.0 5.0 - 8.0   Protein, UA Negative    Urobilinogen, UA 0.2 0.2 or 1.0 E.U./dL   Nitrite, UA Negative    Leukocytes, UA Negative Negative      Assessment & Plan:     Routine Health Maintenance and Physical Exam  Exercise Activities and Dietary recommendations Goals    None      Immunization History  Administered Date(s) Administered  . Influenza, High Dose Seasonal PF 08/26/2017  . Pneumococcal Conjugate-13 08/26/2017  . Tdap 10/05/2011    Health Maintenance  Topic Date Due  . Hepatitis C Screening  [birth year]  . DEXA SCAN  03/22/2018  . PNA vac Low Risk Adult (2 of 2 - PPSV23) 08/26/2018  . MAMMOGRAM  07/06/2019  . TETANUS/TDAP  10/04/2021  . COLONOSCOPY  01/22/2025  . INFLUENZA VACCINE  Completed     Discussed health benefits of physical activity, and encouraged her to engage in regular exercise appropriate for her age and condition.     Problem List Items Addressed This Visit      Musculoskeletal and Integument   Osteopenia    Discussed Vit D/Ca  supplements, not smoking, maintaining  healthy weight, and regular weightbearing exercise Recheck DEXA to see change in BMD      Relevant Orders   DG Bone Density     Other   Dysthymia    Patient does score 1 point for occasionally feeling like she'd rather be dead - states this is long standing issue No active SI, no plan Contracted for safety States she does not like therapists      Relevant Orders   Comprehensive metabolic panel   CBC   Hearing decreased, left    Likely related to cerumen impaction and eustachian tube dysfunction Ears cleaned out today Trial of flonase      Low back pain    No evidence of UTI/pyelo Likely myalgias from deconditioning Advised PT - patient not interested Will give HEP to help patient with stretching and strengthening Red flags discussed as return precautions      Relevant Orders   POCT urinalysis dipstick (Completed)    Other Visit Diagnoses    Healthcare maintenance    -  Primary   Relevant Orders   Lipid panel   Comprehensive metabolic panel   CBC   Need for hepatitis C screening test       Relevant Orders   Hepatitis C Antibody   Impacted cerumen of left ear          Return in about 1 year (around 10/07/2018) for CPE/AWV.  -------------------------------------------------------------------- The entirety of the information documented in the History of Present Illness, Review of Systems and Physical Exam were personally obtained by me. Portions of this information were initially documented by Raquel Sarna Ratchford, CMA and reviewed by me for thoroughness and accuracy.     Lavon Paganini, MD  [facility]

## 2017-10-07 NOTE — Patient Instructions (Signed)

## 2017-10-07 NOTE — Assessment & Plan Note (Signed)
No evidence of UTI/pyelo Likely myalgias from deconditioning Advised PT - patient not interested Will give HEP to help patient with stretching and strengthening Red flags discussed as return precautions

## 2017-10-07 NOTE — Assessment & Plan Note (Signed)
Patient does score 1 point for occasionally feeling like she'd rather be dead - states this is long standing issue No active SI, no plan Contracted for safety States she does not like therapists

## 2017-10-07 NOTE — Assessment & Plan Note (Signed)
Discussed Vit D/Ca supplements, not smoking, maintaining healthy weight, and regular weightbearing exercise Recheck DEXA to see change in BMD

## 2017-10-07 NOTE — Assessment & Plan Note (Signed)
Likely related to cerumen impaction and eustachian tube dysfunction Ears cleaned out today Trial of flonase

## 2017-10-08 DIAGNOSIS — Z1159 Encounter for screening for other viral diseases: Secondary | ICD-10-CM | POA: Diagnosis not present

## 2017-10-08 DIAGNOSIS — M545 Low back pain: Secondary | ICD-10-CM | POA: Diagnosis not present

## 2017-10-08 DIAGNOSIS — M8589 Other specified disorders of bone density and structure, multiple sites: Secondary | ICD-10-CM | POA: Diagnosis not present

## 2017-10-08 DIAGNOSIS — Z1389 Encounter for screening for other disorder: Secondary | ICD-10-CM | POA: Diagnosis not present

## 2017-10-08 DIAGNOSIS — Z0000 Encounter for general adult medical examination without abnormal findings: Secondary | ICD-10-CM | POA: Diagnosis not present

## 2017-10-08 DIAGNOSIS — F341 Dysthymic disorder: Secondary | ICD-10-CM | POA: Diagnosis not present

## 2017-10-09 LAB — CBC
HEMATOCRIT: 43.3 % (ref 35.0–45.0)
HEMOGLOBIN: 14.7 g/dL (ref 11.7–15.5)
MCH: 28.3 pg (ref 27.0–33.0)
MCHC: 33.9 g/dL (ref 32.0–36.0)
MCV: 83.3 fL (ref 80.0–100.0)
MPV: 10.2 fL (ref 7.5–12.5)
Platelets: 289 10*3/uL (ref 140–400)
RBC: 5.2 10*6/uL — AB (ref 3.80–5.10)
RDW: 12.9 % (ref 11.0–15.0)
WBC: 4.2 10*3/uL (ref 3.8–10.8)

## 2017-10-09 LAB — COMPLETE METABOLIC PANEL WITH GFR
AG RATIO: 1.6 (calc) (ref 1.0–2.5)
ALT: 27 U/L (ref 6–29)
AST: 21 U/L (ref 10–35)
Albumin: 4.2 g/dL (ref 3.6–5.1)
Alkaline phosphatase (APISO): 69 U/L (ref 33–130)
BILIRUBIN TOTAL: 0.4 mg/dL (ref 0.2–1.2)
BUN: 13 mg/dL (ref 7–25)
CHLORIDE: 106 mmol/L (ref 98–110)
CO2: 29 mmol/L (ref 20–32)
Calcium: 9.6 mg/dL (ref 8.6–10.4)
Creat: 0.75 mg/dL (ref 0.50–0.99)
GFR, EST AFRICAN AMERICAN: 96 mL/min/{1.73_m2} (ref >=60)
GFR, Est Non African American: 83 mL/min/{1.73_m2} (ref >=60)
GLUCOSE: 88 mg/dL (ref 65–99)
Globulin: 2.7 g/dL (calc) (ref 1.9–3.7)
Potassium: 5.2 mmol/L (ref 3.5–5.3)
Sodium: 142 mmol/L (ref 135–146)
TOTAL PROTEIN: 6.9 g/dL (ref 6.1–8.1)

## 2017-10-09 LAB — LIPID PANEL
CHOLESTEROL: 199 mg/dL (ref <200)
HDL: 45 mg/dL — AB (ref >50)
LDL Cholesterol (Calc): 130 mg/dL (calc) — ABNORMAL HIGH
Non-HDL Cholesterol (Calc): 154 mg/dL (calc) — ABNORMAL HIGH (ref <130)
TRIGLYCERIDES: 126 mg/dL (ref <150)
Total CHOL/HDL Ratio: 4.4 (calc) (ref <5.0)

## 2017-10-09 LAB — HEPATITIS C ANTIBODY
Hepatitis C Ab: NONREACTIVE
SIGNAL TO CUT-OFF: 0.01 (ref <1.00)

## 2017-10-11 ENCOUNTER — Telehealth: Payer: Self-pay | Admitting: Family Medicine

## 2017-10-11 NOTE — Telephone Encounter (Signed)
-----   Message from Virginia Crews, MD sent at 10/09/2017  9:59 AM EST ----- Negative Hep C screening. Normal Blood counts, kidney function, liver function, electrolytes. Cholesterol is borderline.  10 year risk of heart disease/stroke is 5.5%.  Could consider low dose statin to lower this risk.  Above 7.5%, there is a strong recommendation to start statin to decrease this risk.  Ok to continue watching it for now or start medication.  Will leve decision up to patient.  Virginia Crews, MD, MPH [facility] 10/09/2017 9:59 AM

## 2017-10-11 NOTE — Telephone Encounter (Signed)
Advised patient of results.  

## 2017-10-11 NOTE — Telephone Encounter (Signed)
Pt returned nurse call about lab results. Thanks TNP

## 2017-10-19 ENCOUNTER — Encounter: Payer: Self-pay | Admitting: Family Medicine

## 2017-10-19 DIAGNOSIS — H9192 Unspecified hearing loss, left ear: Secondary | ICD-10-CM

## 2017-11-05 DIAGNOSIS — J301 Allergic rhinitis due to pollen: Secondary | ICD-10-CM | POA: Diagnosis not present

## 2017-11-05 DIAGNOSIS — H93299 Other abnormal auditory perceptions, unspecified ear: Secondary | ICD-10-CM | POA: Diagnosis not present

## 2017-11-05 DIAGNOSIS — H6982 Other specified disorders of Eustachian tube, left ear: Secondary | ICD-10-CM | POA: Diagnosis not present

## 2017-11-25 ENCOUNTER — Other Ambulatory Visit: Payer: Managed Care, Other (non HMO)

## 2017-12-22 ENCOUNTER — Other Ambulatory Visit: Payer: Managed Care, Other (non HMO)

## 2018-03-09 ENCOUNTER — Encounter: Payer: Self-pay | Admitting: Family Medicine

## 2018-05-18 ENCOUNTER — Other Ambulatory Visit: Payer: Self-pay

## 2018-05-18 DIAGNOSIS — C50411 Malignant neoplasm of upper-outer quadrant of right female breast: Secondary | ICD-10-CM

## 2018-05-18 DIAGNOSIS — Z170 Estrogen receptor positive status [ER+]: Principal | ICD-10-CM

## 2018-05-18 DIAGNOSIS — Z1231 Encounter for screening mammogram for malignant neoplasm of breast: Secondary | ICD-10-CM

## 2018-07-06 ENCOUNTER — Ambulatory Visit
Admission: RE | Admit: 2018-07-06 | Discharge: 2018-07-06 | Disposition: A | Discharge status: Home / Self Care | Payer: Medicare HMO | Source: Ambulatory Visit | Attending: General Surgery | Admitting: General Surgery

## 2018-07-06 DIAGNOSIS — Z1231 Encounter for screening mammogram for malignant neoplasm of breast: Secondary | ICD-10-CM | POA: Diagnosis not present

## 2018-07-14 ENCOUNTER — Ambulatory Visit: Payer: Medicare HMO | Admitting: General Surgery

## 2018-07-19 ENCOUNTER — Encounter: Payer: Self-pay | Admitting: Family Medicine

## 2018-08-23 ENCOUNTER — Encounter: Payer: Self-pay | Admitting: Family Medicine

## 2018-08-23 ENCOUNTER — Telehealth: Payer: Self-pay

## 2018-08-23 NOTE — Telephone Encounter (Signed)
Called pt to r/s her 08/29/18 AWV. Spoke with husband who states he will speak with pt and have her CB once she's available. Offered the 240 PM apt on 09/06/18.

## 2018-08-25 NOTE — Telephone Encounter (Signed)
Please see TE. -MM

## 2018-08-29 ENCOUNTER — Ambulatory Visit: Payer: Self-pay

## 2018-08-31 ENCOUNTER — Ambulatory Visit: Payer: Self-pay

## 2018-09-02 NOTE — Telephone Encounter (Signed)
LM for pt to CB on my direct line. # [phone]. -MM

## 2018-09-13 NOTE — Telephone Encounter (Signed)
Pt declined scheduling prior to her CPE in November. FYI to PCP. -MM

## 2018-09-14 NOTE — Telephone Encounter (Signed)
Noted  Lacey Jordan, Lacey Bucy, MD, MPH [facility] 09/14/2018 9:46 AM

## 2018-10-06 ENCOUNTER — Encounter: Payer: Self-pay | Admitting: *Deleted

## 2018-10-07 NOTE — Patient Instructions (Signed)
 The CDC recommends two doses of Shingrix (the shingles vaccine) separated by 2 to 6 months for adults age 67 years and older. I recommend checking with your insurance plan regarding coverage for this vaccine.     Preventive Care 65 Years and Older, Female Preventive care refers to lifestyle choices and visits with your health care provider that can promote health and wellness. What does preventive care include?  A yearly physical exam. This is also called an annual well check.  Dental exams once or twice a year.  Routine eye exams. Ask your health care provider how often you should have your eyes checked.  Personal lifestyle choices, including: ? Daily care of your teeth and gums. ? Regular physical activity. ? Eating a healthy diet. ? Avoiding tobacco and drug use. ? Limiting alcohol use. ? Practicing safe sex. ? Taking low-dose aspirin every day. ? Taking vitamin and mineral supplements as recommended by your health care provider. What happens during an annual well check? The services and screenings done by your health care provider during your annual well check will depend on your age, overall health, lifestyle risk factors, and family history of disease. Counseling Your health care provider may ask you questions about your:  Alcohol use.  Tobacco use.  Drug use.  Emotional well-being.  Home and relationship well-being.  Sexual activity.  Eating habits.  History of falls.  Memory and ability to understand (cognition).  Work and work environment.  Reproductive health.  Screening You may have the following tests or measurements:  Height, weight, and BMI.  Blood pressure.  Lipid and cholesterol levels. These may be checked every 5 years, or more frequently if you are over 67 years old.  Skin check.  Lung cancer screening. You may have this screening every year starting at age 55 if you have a 30-pack-year history of smoking and currently smoke or have  quit within the past 15 years.  Fecal occult blood test (FOBT) of the stool. You may have this test every year starting at age 67.  Flexible sigmoidoscopy or colonoscopy. You may have a sigmoidoscopy every 5 years or a colonoscopy every 10 years starting at age 67.  Hepatitis C blood test.  Hepatitis B blood test.  Sexually transmitted disease (STD) testing.  Diabetes screening. This is done by checking your blood sugar (glucose) after you have not eaten for a while (fasting). You may have this done every 1-3 years.  Bone density scan. This is done to screen for osteoporosis. You may have this done starting at age 65.  Mammogram. This may be done every 1-2 years. Talk to your health care provider about how often you should have regular mammograms.  Talk with your health care provider about your test results, treatment options, and if necessary, the need for more tests. Vaccines Your health care provider may recommend certain vaccines, such as:  Influenza vaccine. This is recommended every year.  Tetanus, diphtheria, and acellular pertussis (Tdap, Td) vaccine. You may need a Td booster every 10 years.  Varicella vaccine. You may need this if you have not been vaccinated.  Zoster vaccine. You may need this after age 60.  Measles, mumps, and rubella (MMR) vaccine. You may need at least one dose of MMR if you were born in 1957 or later. You may also need a second dose.  Pneumococcal 13-valent conjugate (PCV13) vaccine. One dose is recommended after age 65.  Pneumococcal polysaccharide (PPSV23) vaccine. One dose is recommended after age 65.    Meningococcal vaccine. You may need this if you have certain conditions.  Hepatitis A vaccine. You may need this if you have certain conditions or if you travel or work in places where you may be exposed to hepatitis A.  Hepatitis B vaccine. You may need this if you have certain conditions or if you travel or work in places where you may be  exposed to hepatitis B.  Haemophilus influenzae type b (Hib) vaccine. You may need this if you have certain conditions.  Talk to your health care provider about which screenings and vaccines you need and how often you need them. This information is not intended to replace advice given to you by your health care provider. Make sure you discuss any questions you have with your health care provider. Document Released: 12/06/2015 Document Revised: 07/29/2016 Document Reviewed: 09/10/2015 Elsevier Interactive Patient Education  2018 [employer]  

## 2018-10-10 ENCOUNTER — Ambulatory Visit (INDEPENDENT_AMBULATORY_CARE_PROVIDER_SITE_OTHER): Payer: Medicare HMO | Admitting: Family Medicine

## 2018-10-10 ENCOUNTER — Encounter: Payer: Self-pay | Admitting: Family Medicine

## 2018-10-10 VITALS — BP 157/89 | HR 75 | Temp 97.9°F | Ht 66.0 in | Wt 158.6 lb

## 2018-10-10 DIAGNOSIS — F4321 Adjustment disorder with depressed mood: Secondary | ICD-10-CM | POA: Diagnosis not present

## 2018-10-10 DIAGNOSIS — E663 Overweight: Secondary | ICD-10-CM

## 2018-10-10 DIAGNOSIS — Z0000 Encounter for general adult medical examination without abnormal findings: Secondary | ICD-10-CM

## 2018-10-10 DIAGNOSIS — Z853 Personal history of malignant neoplasm of breast: Secondary | ICD-10-CM | POA: Diagnosis not present

## 2018-10-10 DIAGNOSIS — Z23 Encounter for immunization: Secondary | ICD-10-CM

## 2018-10-10 DIAGNOSIS — Z780 Asymptomatic menopausal state: Secondary | ICD-10-CM

## 2018-10-10 DIAGNOSIS — M8589 Other specified disorders of bone density and structure, multiple sites: Secondary | ICD-10-CM | POA: Diagnosis not present

## 2018-10-10 DIAGNOSIS — E785 Hyperlipidemia, unspecified: Secondary | ICD-10-CM | POA: Diagnosis not present

## 2018-10-10 NOTE — Progress Notes (Signed)
Patient: Lacey Jordan, Female    DOB: 19-Dec-1950, 67 y.o.   MRN: [id number] Visit Date: 10/10/2018  Today's Provider: Lavon Paganini, MD   Chief Complaint  Patient presents with  . Medicare Wellness   Subjective:    Annual wellness visit Lacey Jordan is a 67 y.o. female who presents today for her Subsequent Annual Wellness Visit. She feels well. She reports exercising none. She reports she is sleeping well.  -----------------------------------------------------------   Review of Systems  Constitutional: Negative.   HENT: Negative.   Eyes: Negative.   Respiratory: Negative.   Cardiovascular: Negative.   Gastrointestinal: Negative.   Endocrine: Negative.   Genitourinary: Negative.   Musculoskeletal: Negative.   Skin: Positive for rash.  Allergic/Immunologic: Positive for environmental allergies.  Neurological: Negative.   Hematological: Negative.   Psychiatric/Behavioral: Negative.     Social History   Socioeconomic History  . Marital status: Married    Spouse name: Kyung Rudd  . Number of children: 2  . Years of education: 21  . Highest education level: Not on file  Occupational History  . Occupation: retired  Scientific laboratory technician  . Financial resource strain: Not on file  . Food insecurity:    Worry: Not on file    Inability: Not on file  . Transportation needs:    Medical: Not on file    Non-medical: Not on file  Tobacco Use  . Smoking status: Former Smoker    Packs/day: 1.00    Years: 20.00    Pack years: 20.00    Types: Cigarettes    Last attempt to quit: 06/12/2001    Years since quitting: 17.3  . Smokeless tobacco: Never Used  . Tobacco comment: quit in 2002  Substance and Sexual Activity  . Alcohol use: Yes    Comment: wine- once per month  . Drug use: No  . Sexual activity: Not on file  Lifestyle  . Physical activity:    Days per week: Not on file    Minutes per session: Not on file  . Stress: Not on file  Relationships  . Social  connections:    Talks on phone: Not on file    Gets together: Not on file    Attends religious service: Not on file    Active member of club or organization: Not on file    Attends meetings of clubs or organizations: Not on file    Relationship status: Not on file  . Intimate partner violence:    Fear of current or ex partner: Not on file    Emotionally abused: Not on file    Physically abused: Not on file    Forced sexual activity: Not on file  Other Topics Concern  . Not on file  Social History Narrative  . Not on file    Patient Active Problem List   Diagnosis Date Noted  . Adjustment disorder with depressed mood 10/07/2017  . Osteopenia 10/07/2017  . Hearing decreased, left 10/07/2017  . Low back pain 10/07/2017  . History of breast cancer 07/18/2013    Past Surgical History:  Procedure Laterality Date  . BREAST BIOPSY Right 2011   invasive mammary carcinoma  . BREAST BIOPSY Right 07/09/2014   fat necrosis  . BREAST EXCISIONAL BIOPSY Right    age of 56 - neg  . BREAST LUMPECTOMY Right 2011   right breast invasive mammary carcinoma  . BREAST LUMPECTOMY WITH SENTINEL LYMPH NODE BIOPSY Right 2011  . BREAST MAMMOSITE Right 2011  . BREAST SURGERY Right  2011   stereo  . COLONOSCOPY  2009  . FRACTURE SURGERY Left 2009  . TONSILLECTOMY     age of 79 yrs  . TUBAL LIGATION  1986    Her family history includes Breast cancer in her cousin and cousin; Breast cancer (age of onset: 92) in her paternal aunt; Diabetes in her mother.     Previous Medications   IBUPROFEN (ADVIL,MOTRIN) 200 MG TABLET    Take 200 mg by mouth every 6 (six) hours as needed.   MULTIPLE VITAMIN (MULTIVITAMIN) TABLET    Take 1 tablet by mouth daily.     Patient Care Team: Virginia Crews, MD as PCP - General (Family Medicine)      Objective:   Vitals: BP (!) 157/89 (BP Location: Left Arm, Patient Position: Sitting, Cuff Size: Normal)   Pulse 75   Temp 97.9 F (36.6 C) (Oral)   Ht 5\' 6"   (1.676 m)   Wt 158 lb 9.6 oz (71.9 kg)   SpO2 99%   BMI 25.60 kg/m   Physical Exam  Constitutional: She is oriented to person, place, and time. She appears well-developed and well-nourished. No distress.  HENT:  Head: Normocephalic and atraumatic.  Right Ear: External ear normal.  Left Ear: External ear normal.  Nose: Nose normal.  Mouth/Throat: Oropharynx is clear and moist.  Eyes: Pupils are equal, round, and reactive to light. Conjunctivae and EOM are normal. No scleral icterus.  Neck: Neck supple. No thyromegaly present.  Cardiovascular: Normal rate, regular rhythm, normal heart sounds and intact distal pulses.  No murmur heard. Pulmonary/Chest: Effort normal and breath sounds normal. No respiratory distress. She has no wheezes. She has no rales.  Abdominal: Soft. Bowel sounds are normal. She exhibits no distension. There is no tenderness. There is no rebound and no guarding.  Musculoskeletal: She exhibits no edema or deformity.  Lymphadenopathy:    She has no cervical adenopathy.  Neurological: She is alert and oriented to person, place, and time.  Skin: Skin is warm and dry. Capillary refill takes less than 2 seconds. No rash noted.  Psychiatric: Her speech is normal and behavior is normal. Her affect is blunt. She exhibits a depressed mood. She expresses no homicidal and no suicidal ideation.  Intermittently tearful  Vitals reviewed.   Activities of Daily Living In your present state of health, do you have any difficulty performing the following activities: 10/10/2018  Hearing? N  Vision? N  Difficulty concentrating or making decisions? N  Walking or climbing stairs? N  Dressing or bathing? N  Doing errands, shopping? N  Some recent data might be hidden    Fall Risk Assessment Fall Risk  10/10/2018 10/07/2017 08/26/2017  Falls in the past year? 0 No No     Depression Screen PHQ 2/9 Scores 10/10/2018 10/07/2017 08/26/2017  PHQ - 2 Score 6 2 3   PHQ- 9 Score 15 7 4       Cognitive Testing - 6-CIT  Correct? Score   What year is it? yes 0 0 or 4  What month is it? yes 0 0 or 3  Memorize:    Lacey Jordan,  42,  [place],      What time is it? (within 1 hour) yes 0 0 or 3  Count backwards from 20 yes 0 0, 2, or 4  Name the months of the year yes 0 0, 2, or 4  Repeat name & address above yes 0 0, 2, 4, 6, 8,  or 10       TOTAL SCORE  0/28   Interpretation:  Normal  Normal (0-7) Abnormal (8-28)     Assessment & Plan:     Annual Wellness Visit  Reviewed patient's Family Medical History Reviewed and updated list of patient's medical providers Assessment of cognitive impairment was done Assessed patient's functional ability Established a written schedule for health screening [place] Completed and Reviewed  Exercise Activities and Dietary recommendations Goals    . Increase water intake     Recommend increasing water intake to 3 glasses a day.        Immunization History  Administered Date(s) Administered  . Influenza, High Dose Seasonal PF 08/26/2017, 10/10/2018  . Pneumococcal Conjugate-13 08/26/2017  . Pneumococcal Polysaccharide-23 10/10/2018  . Tdap 10/05/2011  . Zoster Recombinat (Shingrix) 07/19/2018    Health Maintenance  Topic Date Due  . DEXA SCAN  03/22/2018  . MAMMOGRAM  07/06/2020  . TETANUS/TDAP  10/04/2021  . COLONOSCOPY  01/22/2025  . INFLUENZA VACCINE  Completed  . Hepatitis C Screening  Completed  . PNA vac Low Risk Adult  Completed     Discussed health benefits of physical activity, and encouraged her to engage in regular exercise appropriate for her age and condition.    ------------------------------------------------------------------------------------------------------------  Problem List Items Addressed This Visit      Musculoskeletal and Integument   Osteopenia   Relevant Orders   DG Bone Density     Other   History of breast cancer   Relevant Orders   CBC  w/Diff/Platelet   Adjustment disorder with depressed mood    Related to son's recent suicide She feels like she is doing better each day Not interested in medications or therapy       Other Visit Diagnoses    Medicare annual wellness visit, subsequent    -  Primary   Postmenopausal       Relevant Orders   DG Bone Density   Hyperlipidemia, unspecified hyperlipidemia type       Relevant Orders   CBC w/Diff/Platelet   Lipid panel   Comprehensive metabolic panel   Overweight       Relevant Orders   CBC w/Diff/Platelet   Lipid panel   Comprehensive metabolic panel   Need for influenza vaccination       Relevant Orders   Flu vaccine HIGH DOSE PF (Completed)   Need for pneumococcal vaccination       Relevant Orders   Pneumococcal polysaccharide vaccine 23-valent greater than or equal to 2yo subcutaneous/IM (Completed)       Return in about 1 year (around 10/11/2019) for [place].   The entirety of the information documented in the History of Present Illness, Review of Systems and Physical Exam were personally obtained by me. Portions of this information were initially documented by Tiburcio Pea, CMA and reviewed by me for thoroughness and accuracy.    Virginia Crews, MD, MPH [facility] 10/10/2018 10:26 AM

## 2018-10-10 NOTE — Assessment & Plan Note (Signed)
Related to son's recent suicide She feels like she is doing better each day Not interested in medications or therapy

## 2018-10-11 LAB — CBC WITH DIFFERENTIAL/PLATELET
BASOS: 1 %
Basophils Absolute: 0 10*3/uL (ref 0.0–0.2)
EOS (ABSOLUTE): 0.1 10*3/uL (ref 0.0–0.4)
EOS: 2 %
HEMATOCRIT: 44.2 % (ref 34.0–46.6)
Hemoglobin: 14.4 g/dL (ref 11.1–15.9)
IMMATURE GRANS (ABS): 0 10*3/uL (ref 0.0–0.1)
Immature Granulocytes: 0 %
LYMPHS: 30 %
Lymphocytes Absolute: 1.9 10*3/uL (ref 0.7–3.1)
MCH: 27.6 pg (ref 26.6–33.0)
MCHC: 32.6 g/dL (ref 31.5–35.7)
MCV: 85 fL (ref 79–97)
Monocytes Absolute: 0.5 10*3/uL (ref 0.1–0.9)
Monocytes: 8 %
NEUTROS PCT: 59 %
Neutrophils Absolute: 3.7 10*3/uL (ref 1.4–7.0)
Platelets: 274 10*3/uL (ref 150–450)
RBC: 5.22 x10E6/uL (ref 3.77–5.28)
RDW: 14 % (ref 12.3–15.4)
WBC: 6.2 10*3/uL (ref 3.4–10.8)

## 2018-10-11 LAB — COMPREHENSIVE METABOLIC PANEL
ALK PHOS: 70 IU/L (ref 39–117)
ALT: 28 IU/L (ref 0–32)
AST: 26 IU/L (ref 0–40)
Albumin/Globulin Ratio: 1.7 (ref 1.2–2.2)
Albumin: 4.3 g/dL (ref 3.6–4.8)
BILIRUBIN TOTAL: 0.3 mg/dL (ref 0.0–1.2)
BUN/Creatinine Ratio: 18 (ref 12–28)
BUN: 14 mg/dL (ref 8–27)
CO2: 20 mmol/L (ref 20–29)
Calcium: 9.9 mg/dL (ref 8.7–10.3)
Chloride: 103 mmol/L (ref 96–106)
Creatinine, Ser: 0.78 mg/dL (ref 0.57–1.00)
GFR calc Af Amer: 91 mL/min/{1.73_m2} (ref >59)
GFR calc non Af Amer: 79 mL/min/{1.73_m2} (ref >59)
GLUCOSE: 84 mg/dL (ref 65–99)
Globulin, Total: 2.5 g/dL (ref 1.5–4.5)
Potassium: 4.3 mmol/L (ref 3.5–5.2)
SODIUM: 139 mmol/L (ref 134–144)
Total Protein: 6.8 g/dL (ref 6.0–8.5)

## 2018-10-11 LAB — LIPID PANEL
CHOL/HDL RATIO: 4.3 ratio (ref 0.0–4.4)
Cholesterol, Total: 196 mg/dL (ref 100–199)
HDL: 46 mg/dL (ref >39)
LDL Calculated: 129 mg/dL — ABNORMAL HIGH (ref 0–99)
Triglycerides: 104 mg/dL (ref 0–149)
VLDL CHOLESTEROL CAL: 21 mg/dL (ref 5–40)

## 2018-10-24 DIAGNOSIS — M8588 Other specified disorders of bone density and structure, other site: Secondary | ICD-10-CM | POA: Diagnosis not present

## 2018-10-24 DIAGNOSIS — Z780 Asymptomatic menopausal state: Secondary | ICD-10-CM | POA: Diagnosis not present

## 2018-10-24 DIAGNOSIS — M85852 Other specified disorders of bone density and structure, left thigh: Secondary | ICD-10-CM | POA: Diagnosis not present

## 2018-10-24 DIAGNOSIS — M810 Age-related osteoporosis without current pathological fracture: Secondary | ICD-10-CM | POA: Diagnosis not present

## 2018-10-24 LAB — HM DEXA SCAN

## 2018-11-15 ENCOUNTER — Telehealth: Payer: Self-pay

## 2018-11-15 NOTE — Telephone Encounter (Signed)
-----   Message from Mar Daring, [place] sent at 11/14/2018  4:41 PM EST ----- Bone density does show a decrease in bone density, specifically in the femur and hips of the lower legs. If you would like to discuss treatment for osteoporosis please schedule an appt with Dr. Jacinto Reap. If not desired, make sure to be taking at least 1200mg  calcium and 800IU Vit D daily, as well as doing weight bearing exercises (such as walking or yoga) for conservative bone health.

## 2018-11-15 NOTE — Telephone Encounter (Signed)
LMTCB

## 2018-11-15 NOTE — Telephone Encounter (Signed)
Viewed by Gillermina Hu on 11/14/2018 6:30 PM

## 2019-01-24 ENCOUNTER — Encounter: Payer: Self-pay | Admitting: Family Medicine

## 2019-02-13 ENCOUNTER — Encounter: Payer: Self-pay | Admitting: Family Medicine

## 2019-02-13 MED ORDER — DESOXIMETASONE 0.25 % EX CREA: 1.0000 "application " | TOPICAL_CREAM | Freq: Two times a day (BID) | CUTANEOUS | 0 refills | Status: DC

## 2019-02-20 ENCOUNTER — Encounter: Payer: Self-pay | Admitting: Family Medicine

## 2019-03-23 ENCOUNTER — Encounter: Payer: Self-pay | Admitting: Family Medicine

## 2019-04-10 ENCOUNTER — Ambulatory Visit: Payer: Medicare HMO | Admitting: Family Medicine

## 2019-04-10 ENCOUNTER — Encounter: Payer: Self-pay | Admitting: Family Medicine

## 2019-04-26 ENCOUNTER — Encounter: Payer: Self-pay | Admitting: Family Medicine

## 2019-05-23 ENCOUNTER — Encounter: Payer: Self-pay | Admitting: Family Medicine

## 2019-06-21 ENCOUNTER — Encounter: Payer: Self-pay | Admitting: Family Medicine

## 2019-10-16 ENCOUNTER — Ambulatory Visit: Payer: Medicare HMO

## 2019-10-16 ENCOUNTER — Encounter: Payer: Medicare HMO | Admitting: Family Medicine

## 2019-10-23 ENCOUNTER — Encounter: Payer: Self-pay | Admitting: Family Medicine

## 2019-12-02 ENCOUNTER — Encounter: Payer: Self-pay | Admitting: Family Medicine

## 2019-12-04 NOTE — Telephone Encounter (Signed)
I would check with the CDC and the allergy and immunology society.  There is a thought that PEG could be the factor that causes anaphylactic reactions, but we do not know for sure  It may be worth a visit with an allergist, but we are not recommending the vaccine to anyone with an allergy to any of the components

## 2020-01-17 NOTE — Progress Notes (Signed)
Subjective:   Lacey Jordan is a 69 y.o. female who presents for Medicare Annual (Subsequent) preventive examination.    This visit is being conducted through telemedicine due to the COVID-19 pandemic. This patient has given me verbal consent via doximity to conduct this visit, patient states they are participating from their home address. Some vital signs may be absent or patient reported.    Patient identification: identified by name, DOB, and current address  Review of Systems:  N/A  Cardiac Risk Factors include: advanced age (>17men, >79 women)     Objective:     Vitals: There were no vitals taken for this visit.  There is no height or weight on file to calculate BMI. Unable to obtain vitals due to visit being conducted via telephonically.   Advanced Directives 01/18/2020 08/26/2017  Does Patient Have a Medical Advance Directive? Yes Yes  Type of Paramedic of [place];Living will [place];Living will  Copy of [place] in Chart? No - copy requested No - copy requested    Tobacco Social History   Tobacco Use  Smoking Status Former Smoker  . Packs/day: 1.00  . Years: 20.00  . Pack years: 20.00  . Types: Cigarettes  . Quit date: 06/12/2001  . Years since quitting: 18.6  Smokeless Tobacco Never Used  Tobacco Comment   quit in 2002     Counseling given: Not Answered Comment: quit in 2002   Clinical Intake:  Pre-visit preparation completed: Yes  Pain : No/denies pain Pain Score: 0-No pain     Nutritional Risks: None Diabetes: No  How often do you need to have someone help you when you read instructions, pamphlets, or other written materials from your doctor or pharmacy?: 1 - Never  Interpreter Needed?: No  Information entered by :: [facility], LPN  Past Medical History:  Diagnosis Date  . Arthritis 2009  . Breast cancer [facility]) 2011   right breast with mammosite  . Bronchitis   . Cancer  [facility]) 2011   right breast, L/SN/R stage 1 cancer ER/PR pos HER 2 negative  . Colitis 1980  . Malignant neoplasm of lower-outer quadrant of female breast ([place]) 2011  . Special screening for malignant neoplasms, colon   . Ulcer 1969   stomach  . Wrist fracture    age of 60   Past Surgical History:  Procedure Laterality Date  . BREAST BIOPSY Right 2011   invasive mammary carcinoma  . BREAST BIOPSY Right 07/09/2014   fat necrosis  . BREAST EXCISIONAL BIOPSY Right    age of 58 - neg  . BREAST LUMPECTOMY Right 2011   right breast invasive mammary carcinoma  . BREAST LUMPECTOMY WITH SENTINEL LYMPH NODE BIOPSY Right 2011  . BREAST MAMMOSITE Right 2011  . BREAST SURGERY Right 2011   stereo  . COLONOSCOPY  2009  . FRACTURE SURGERY Left 2009  . TONSILLECTOMY     age of 43 yrs  . TUBAL LIGATION  1986   Family History  Problem Relation Age of Onset  . Breast cancer Paternal Aunt 27  . Breast cancer Cousin   . Breast cancer Cousin   . Diabetes Mother   . Obesity Son    Social History   Socioeconomic History  . Marital status: Married    Spouse name: Kyung Rudd  . Number of children: 2  . Years of education: 76  . Highest education level: Some college, no degree  Occupational History  . Occupation:  retired  Tobacco Use  . Smoking status: Former Smoker    Packs/day: 1.00    Years: 20.00    Pack years: 20.00    Types: Cigarettes    Quit date: 06/12/2001    Years since quitting: 18.6  . Smokeless tobacco: Never Used  . Tobacco comment: quit in 2002  Substance and Sexual Activity  . Alcohol use: Yes    Comment: wine- once per month  . Drug use: No  . Sexual activity: Not on file  Other Topics Concern  . Not on file  Social History Narrative  . Not on file   Social Determinants of Health   Financial Resource Strain: Low Risk   . Difficulty of Paying Living Expenses: Not hard at all  Food Insecurity: No Food Insecurity  . Worried About Charity fundraiser in the Last  Year: Never true  . Ran Out of Food in the Last Year: Never true  Transportation Needs: No Transportation Needs  . Lack of Transportation (Medical): No  . Lack of Transportation (Non-Medical): No  Physical Activity: Inactive  . Days of Exercise per Week: 0 days  . Minutes of Exercise per Session: 0 min  Stress: No Stress Concern Present  . Feeling of Stress : Not at all  Social Connections: Moderately Isolated  . Frequency of Communication with Friends and Family: Never  . Frequency of Social Gatherings with Friends and Family: Never  . Attends Religious Services: Never  . Active Member of Clubs or Organizations: No  . Attends Archivist Meetings: Never  . Marital Status: Married    Outpatient Encounter Medications as of 01/18/2020  Medication Sig  . Calcium-Magnesium-Zinc 500-250-12.5 MG TABS Take 1 tablet by mouth daily.   . Cholecalciferol (VITAMIN D3) 125 MCG (5000 UT) TABS Take 5,000 Units by mouth daily.   Marland Kitchen desoximetasone (TOPICORT) 0.25 % cream Apply 1 application topically 2 (two) times daily. (Patient taking differently: Apply 1 application topically 2 (two) times daily. As needed)  . ibuprofen (ADVIL,MOTRIN) 200 MG tablet Take 200 mg by mouth every 6 (six) hours as needed.  . Melatonin 3 MG TABS Take 3 mg by mouth at bedtime as needed.  . Multiple Vitamin (MULTIVITAMIN) tablet Take 1 tablet by mouth daily.   . Omega 3 1200 MG CAPS Take 1,200 mg by mouth daily.   . Thiamine HCl (VITAMIN B-1) 250 MG tablet Take 250 mg by mouth daily.   . Zinc Picolinate 25 MG TABS Take 25 mg by mouth 3 (three) times a week.    No facility-administered encounter medications on file as of 01/18/2020.    Activities of Daily Living In your present state of health, do you have any difficulty performing the following activities: 01/18/2020  Hearing? N  Vision? N  Difficulty concentrating or making decisions? N  Walking or climbing stairs? Y  Comment When it is hot pt gets SOB.    Dressing or bathing? N  Doing errands, shopping? N  Preparing Food and eating ? N  Using the Toilet? N  In the past six months, have you accidently leaked urine? Y  Comment Occasionally with urges.  Do you have problems with loss of bowel control? N  Managing your Medications? N  Managing your Finances? N  Housekeeping or managing your Housekeeping? N  Some recent data might be hidden    Patient Care Team: Virginia Crews, MD as PCP - General (Family Medicine)    Assessment:   This is  a routine wellness examination for [facility].  Exercise Activities and Dietary recommendations Current Exercise Habits: The patient does not participate in regular exercise at present, Exercise limited by: None identified  Goals    . Exercise 3x per week (30 min per time)     Recommend to exercise for 3 days a week for at least 30 minutes at a time.     . Increase water intake     Recommend increasing water intake to 3 glasses a day.        Fall Risk: Fall Risk  01/18/2020 10/10/2018 10/07/2017 08/26/2017  Falls in the past year? 0 0 No No  Number falls in past yr: 0 - - -  Injury with Fall? 0 - - -    FALL RISK PREVENTION PERTAINING TO THE HOME:  Any stairs in or around the home? Yes  If so, are there any without handrails? No   Home free of loose throw rugs in walkways, pet beds, electrical cords, etc? Yes  Adequate lighting in your home to reduce risk of falls? Yes   ASSISTIVE DEVICES UTILIZED TO PREVENT FALLS:  Life alert? No  Use of a cane, walker or w/c? No  Grab bars in the bathroom? No  Shower chair or bench in shower? No  Elevated toilet seat or a handicapped toilet? No    TIMED UP AND GO:  Was the test performed? No .    Depression Screen PHQ 2/9 Scores 01/18/2020 10/10/2018 10/07/2017 08/26/2017  PHQ - 2 Score 1 6 2 3   PHQ- 9 Score - 15 7 4      Cognitive Function: Declined today.     6CIT Screen 08/26/2017  What Year? 0 points  What month? 0 points   What time? 0 points  Count back from 20 0 points  Months in reverse 0 points  Repeat phrase 0 points  Total Score 0    Immunization History  Administered Date(s) Administered  . Influenza, High Dose Seasonal PF 08/26/2017, 10/10/2018  . Pneumococcal Conjugate-13 08/26/2017  . Pneumococcal Polysaccharide-23 10/10/2018  . Tdap 10/05/2011  . Zoster Recombinat (Shingrix) 07/15/2018, 05/23/2019    Qualifies for Shingles Vaccine? Completed series  Tdap: Up to date  Flu Vaccine: Due for Flu vaccine. Does the patient want to receive this vaccine today?  No . Advised may receive this vaccine at local pharmacy or Health Dept. Aware to provide a copy of the vaccination record if obtained from local pharmacy or Health Dept. Verbalized acceptance and understanding.  Pneumococcal Vaccine: Completed series  Screening Tests Health Maintenance  Topic Date Due  . INFLUENZA VACCINE  02/21/2020 (Originally 06/24/2019)  . MAMMOGRAM  07/06/2020  . DEXA SCAN  10/24/2020  . TETANUS/TDAP  10/04/2021  . COLONOSCOPY  01/22/2025  . Hepatitis C Screening  Completed  . PNA vac Low Risk Adult  Completed    Cancer Screenings:  Colorectal Screening: Completed 01/23/15. Repeat every 10 years.   Mammogram: Completed 07/06/18. Repeat every 1-2 years as advised.   Bone Density: Completed 10/24/18. Results reflect OSTEOPOROSIS. Repeat every 2 years.   Lung Cancer Screening: (Low Dose CT Chest recommended if Age 51-80 years, 30 pack-year currently smoking OR have quit w/in 15years.) does not qualify.   Additional Screening:  Hepatitis C Screening: Up to date  Vision Screening: Recommended annual ophthalmology exams for early detection of glaucoma and other disorders of the eye.  Dental Screening: Recommended annual dental exams for proper oral hygiene  Community Resource Referral:  CRR required  this visit?  No       Plan:  I have personally reviewed and addressed the Medicare Annual Wellness  questionnaire and have noted the following in the patient's chart:  A. Medical and social history B. Use of alcohol, tobacco or illicit drugs  C. Current medications and supplements D. Functional ability and status E.  Nutritional status F.  Physical activity G. Advance directives H. List of other physicians I.  Hospitalizations, surgeries, and ER visits in previous 12 months J.  [place] such as hearing and vision if needed, cognitive and depression L. Referrals and appointments   In addition, I have reviewed and discussed with patient certain preventive protocols, quality metrics, and best practice recommendations. A written personalized care plan for preventive services as well as general preventive health recommendations were provided to patient. Nurse Health Advisor  Signed,    Alexys Lobello [place], [place]  [phone] Nurse Health Advisor   Nurse Notes: Pt declined receiving a flu shot this year.

## 2020-01-18 ENCOUNTER — Ambulatory Visit (INDEPENDENT_AMBULATORY_CARE_PROVIDER_SITE_OTHER): Payer: Medicare HMO

## 2020-01-18 ENCOUNTER — Other Ambulatory Visit: Payer: Self-pay

## 2020-01-18 DIAGNOSIS — Z0000 Encounter for general adult medical examination without abnormal findings: Secondary | ICD-10-CM | POA: Diagnosis not present

## 2020-01-18 NOTE — Patient Instructions (Signed)
Ms. Lacey Jordan , Thank you for taking time to come for your Medicare Wellness Visit. I appreciate your ongoing commitment to your health goals. Please review the following plan we discussed and let me know if I can assist you in the future.   Screening recommendations/referrals: Colonoscopy: Up to date, due 01/2025 Mammogram: Up to date, due 06/2020 Bone Density: Up to date, due 10/2020 Recommended yearly ophthalmology/optometry visit for glaucoma screening and checkup Recommended yearly dental visit for hygiene and checkup  Vaccinations: Influenza vaccine: Pt declines today.  Pneumococcal vaccine: Completed series Tdap vaccine: Up to date, due 09/2021 Shingles vaccine: Completed series    Advanced directives: Please bring a copy of your POA (Power of Attorney) and/or Living Will to your next appointment.   Conditions/risks identified: Recommend to increase water intake to 6-8 8 oz glasses a day. Also recommend to start exercising 3 days a week for at least 30 minutes at a time.   Next appointment: 01/23/68 for an AWV. Declined scheduling a follow up with PCP at this time.    Preventive Care 69 Years and Older, Female Preventive care refers to lifestyle choices and visits with your health care provider that can promote health and wellness. What does preventive care include?  A yearly physical exam. This is also called an annual well check.  Dental exams once or twice a year.  Routine eye exams. Ask your health care provider how often you should have your eyes checked.  Personal lifestyle choices, including:  Daily care of your teeth and gums.  Regular physical activity.  Eating a healthy diet.  Avoiding tobacco and drug use.  Limiting alcohol use.  Practicing safe sex.  Taking low-dose aspirin every day.  Taking vitamin and mineral supplements as recommended by your health care provider. What happens during an annual well check? The services and screenings done by your  health care provider during your annual well check will depend on your age, overall health, lifestyle risk factors, and family history of disease. Counseling  Your health care provider may ask you questions about your:  Alcohol use.  Tobacco use.  Drug use.  Emotional well-being.  Home and relationship well-being.  Sexual activity.  Eating habits.  History of falls.  Memory and ability to understand (cognition).  Work and work Statistician.  Reproductive health. Screening  You may have the following tests or measurements:  Height, weight, and BMI.  Blood pressure.  Lipid and cholesterol levels. These may be checked every 5 years, or more frequently if you are over 105 years old.  Skin check.  Lung cancer screening. You may have this screening every year starting at age 46 if you have a 30-pack-year history of smoking and currently smoke or have quit within the past 15 years.  Fecal occult blood test (FOBT) of the stool. You may have this test every year starting at age 17.  Flexible sigmoidoscopy or colonoscopy. You may have a sigmoidoscopy every 5 years or a colonoscopy every 10 years starting at age 65.  Hepatitis C blood test.  Hepatitis B blood test.  Sexually transmitted disease (STD) testing.  Diabetes screening. This is done by checking your blood sugar (glucose) after you have not eaten for a while (fasting). You may have this done every 1-3 years.  Bone density scan. This is done to screen for osteoporosis. You may have this done starting at age 32.  Mammogram. This may be done every 1-2 years. Talk to your health care provider about how  often you should have regular mammograms. Talk with your health care provider about your test results, treatment options, and if necessary, the need for more tests. Vaccines  Your health care provider may recommend certain vaccines, such as:  Influenza vaccine. This is recommended every year.  Tetanus, diphtheria, and  acellular pertussis (Tdap, Td) vaccine. You may need a Td booster every 10 years.  Zoster vaccine. You may need this after age 72.  Pneumococcal 13-valent conjugate (PCV13) vaccine. One dose is recommended after age 74.  Pneumococcal polysaccharide (PPSV23) vaccine. One dose is recommended after age 11. Talk to your health care provider about which screenings and vaccines you need and how often you need them. This information is not intended to replace advice given to you by your health care provider. Make sure you discuss any questions you have with your health care provider. Document Released: 12/06/2015 Document Revised: 07/29/2016 Document Reviewed: 09/10/2015 Elsevier Interactive Patient Education  2017 [place] Prevention in the Home Falls can cause injuries. They can happen to people of all ages. There are many things you can do to make your home safe and to help prevent falls. What can I do on the outside of my home?  Regularly fix the edges of walkways and driveways and fix any cracks.  Remove anything that might make you trip as you walk through a door, such as a raised step or threshold.  Trim any bushes or trees on the path to your home.  Use bright outdoor lighting.  Clear any walking paths of anything that might make someone trip, such as rocks or tools.  Regularly check to see if handrails are loose or broken. Make sure that both sides of any steps have handrails.  Any raised decks and porches should have guardrails on the edges.  Have any leaves, snow, or ice cleared regularly.  Use sand or salt on walking paths during winter.  Clean up any spills in your garage right away. This includes oil or grease spills. What can I do in the bathroom?  Use night lights.  Install grab bars by the toilet and in the tub and shower. Do not use towel bars as grab bars.  Use non-skid mats or decals in the tub or shower.  If you need to sit down in the shower, use  a plastic, non-slip stool.  Keep the floor dry. Clean up any water that spills on the floor as soon as it happens.  Remove soap buildup in the tub or shower regularly.  Attach bath mats securely with double-sided non-slip rug tape.  Do not have throw rugs and other things on the floor that can make you trip. What can I do in the bedroom?  Use night lights.  Make sure that you have a light by your bed that is easy to reach.  Do not use any sheets or blankets that are too big for your bed. They should not hang down onto the floor.  Have a firm chair that has side arms. You can use this for support while you get dressed.  Do not have throw rugs and other things on the floor that can make you trip. What can I do in the kitchen?  Clean up any spills right away.  Avoid walking on wet floors.  Keep items that you use a lot in easy-to-reach places.  If you need to reach something above you, use a strong step stool that has a grab bar.  Keep electrical  cords out of the way.  Do not use floor polish or wax that makes floors slippery. If you must use wax, use non-skid floor wax.  Do not have throw rugs and other things on the floor that can make you trip. What can I do with my stairs?  Do not leave any items on the stairs.  Make sure that there are handrails on both sides of the stairs and use them. Fix handrails that are broken or loose. Make sure that handrails are as long as the stairways.  Check any carpeting to make sure that it is firmly attached to the stairs. Fix any carpet that is loose or worn.  Avoid having throw rugs at the top or bottom of the stairs. If you do have throw rugs, attach them to the floor with carpet tape.  Make sure that you have a light switch at the top of the stairs and the bottom of the stairs. If you do not have them, ask someone to add them for you. What else can I do to help prevent falls?  Wear shoes that:  Do not have high heels.  Have  rubber bottoms.  Are comfortable and fit you well.  Are closed at the toe. Do not wear sandals.  If you use a stepladder:  Make sure that it is fully opened. Do not climb a closed stepladder.  Make sure that both sides of the stepladder are locked into place.  Ask someone to hold it for you, if possible.  Clearly mark and make sure that you can see:  Any grab bars or handrails.  First and last steps.  Where the edge of each step is.  Use tools that help you move around (mobility aids) if they are needed. These include:  Canes.  Walkers.  Scooters.  Crutches.  Turn on the lights when you go into a dark area. Replace any light bulbs as soon as they burn out.  Set up your furniture so you have a clear path. Avoid moving your furniture around.  If any of your floors are uneven, fix them.  If there are any pets around you, be aware of where they are.  Review your medicines with your doctor. Some medicines can make you feel dizzy. This can increase your chance of falling. Ask your doctor what other things that you can do to help prevent falls. This information is not intended to replace advice given to you by your health care provider. Make sure you discuss any questions you have with your health care provider. Document Released: 09/05/2009 Document Revised: 04/16/2016 Document Reviewed: 12/14/2014 Elsevier Interactive Patient Education  2017 [employer].

## 2020-01-19 ENCOUNTER — Ambulatory Visit: Payer: Medicare HMO | Attending: Internal Medicine

## 2020-01-19 DIAGNOSIS — Z23 Encounter for immunization: Secondary | ICD-10-CM | POA: Insufficient documentation

## 2020-01-19 NOTE — Progress Notes (Signed)
   Covid-19 Vaccination Clinic  Name:  Kareema Prudente    MRN: [id number] DOB: 1951-02-06  01/19/2020  Ms. Lindenmeyer was observed post Covid-19 immunization for 15 minutes without incidence. She was provided with Vaccine Information Sheet and instruction to access the V-Safe system.   Ms. Lilja was instructed to call 911 with any severe reactions post vaccine: Marland Kitchen Difficulty breathing  . Swelling of your face and throat  . A fast heartbeat  . A bad rash all over your body  . Dizziness and weakness    Immunizations Administered    Name Date Dose VIS Date Route   Pfizer COVID-19 Vaccine 01/19/2020  8:35 AM 0.3 mL 11/03/2019 Intramuscular   Manufacturer: [place]   Lot: [id number]   [place]: [id number]

## 2020-02-14 ENCOUNTER — Ambulatory Visit: Payer: Medicare HMO | Attending: Internal Medicine

## 2020-02-14 DIAGNOSIS — Z23 Encounter for immunization: Secondary | ICD-10-CM

## 2020-02-14 NOTE — Progress Notes (Signed)
   Covid-19 Vaccination Clinic  Name:  Lacey Jordan    MRN: [id number] DOB: Feb 23, 1951  02/14/2020  [name] was observed post Covid-19 immunization for 15 minutes without incident. She was provided with Vaccine Information Sheet and instruction to access the V-Safe system.   [name] was instructed to call 911 with any severe reactions post vaccine: Marland Kitchen Difficulty breathing  . Swelling of face and throat  . A fast heartbeat  . A bad rash all over body  . Dizziness and weakness   Immunizations Administered    Name Date Dose VIS Date Route   Pfizer COVID-19 Vaccine 02/14/2020  9:25 AM 0.3 mL 11/03/2019 Intramuscular   Manufacturer: [employer]   Lot: [id number]   [place]: [id number]

## 2020-03-28 ENCOUNTER — Ambulatory Visit (INDEPENDENT_AMBULATORY_CARE_PROVIDER_SITE_OTHER): Payer: Medicare HMO | Admitting: Family Medicine

## 2020-03-28 ENCOUNTER — Other Ambulatory Visit: Payer: Self-pay

## 2020-03-28 VITALS — BP 138/70 | HR 74 | Temp 97.1°F | Resp 16 | Ht 65.5 in | Wt 158.0 lb

## 2020-03-28 DIAGNOSIS — M8589 Other specified disorders of bone density and structure, multiple sites: Secondary | ICD-10-CM | POA: Diagnosis not present

## 2020-03-28 DIAGNOSIS — Z0000 Encounter for general adult medical examination without abnormal findings: Secondary | ICD-10-CM | POA: Diagnosis not present

## 2020-03-28 DIAGNOSIS — E663 Overweight: Secondary | ICD-10-CM | POA: Insufficient documentation

## 2020-03-28 DIAGNOSIS — Z1231 Encounter for screening mammogram for malignant neoplasm of breast: Secondary | ICD-10-CM

## 2020-03-28 DIAGNOSIS — E785 Hyperlipidemia, unspecified: Secondary | ICD-10-CM

## 2020-03-28 NOTE — Patient Instructions (Signed)
Preventive Care 69 Years and Older, Female Preventive care refers to lifestyle choices and visits with your health care provider that can promote health and wellness. This includes:  A yearly physical exam. This is also called an annual well check.  Regular dental and eye exams.  Immunizations.  Screening for certain conditions.  Healthy lifestyle choices, such as diet and exercise. What can I expect for my preventive care visit? Physical exam Your health care provider will check:  Height and weight. These may be used to calculate body mass index (BMI), which is a measurement that tells if you are at a healthy weight.  Heart rate and blood pressure.  Your skin for abnormal spots. Counseling Your health care provider may ask you questions about:  Alcohol, tobacco, and drug use.  Emotional well-being.  Home and relationship well-being.  Sexual activity.  Eating habits.  History of falls.  Memory and ability to understand (cognition).  Work and work Statistician.  Pregnancy and menstrual history. What immunizations do I need?  Influenza (flu) vaccine  This is recommended every year. Tetanus, diphtheria, and pertussis (Tdap) vaccine  You may need a Td booster every 10 years. Varicella (chickenpox) vaccine  You may need this vaccine if you have not already been vaccinated. Zoster (shingles) vaccine  You may need this after age 69. Pneumococcal conjugate (PCV13) vaccine  One dose is recommended after age 69. Pneumococcal polysaccharide (PPSV23) vaccine  One dose is recommended after age 69. Measles, mumps, and rubella (MMR) vaccine  You may need at least one dose of MMR if you were born in 1957 or later. You may also need a second dose. Meningococcal conjugate (MenACWY) vaccine  You may need this if you have certain conditions. Hepatitis A vaccine  You may need this if you have certain conditions or if you travel or work in places where you may be exposed  to hepatitis A. Hepatitis B vaccine  You may need this if you have certain conditions or if you travel or work in places where you may be exposed to hepatitis B. Haemophilus influenzae type b (Hib) vaccine  You may need this if you have certain conditions. You may receive vaccines as individual doses or as more than one vaccine together in one shot (combination vaccines). Talk with your health care provider about the risks and benefits of combination vaccines. What tests do I need? Blood tests  Lipid and cholesterol levels. These may be checked every 5 years, or more frequently depending on your overall health.  Hepatitis C test.  Hepatitis B test. Screening  Lung cancer screening. You may have this screening every year starting at age 69 if you have a 30-pack-year history of smoking and currently smoke or have quit within the past 15 years.  Colorectal cancer screening. All adults should have this screening starting at age 69 and continuing until age 15. Your health care provider may recommend screening at age 69 if you are at increased risk. You will have tests every 1-10 years, depending on your results and the type of screening test.  Diabetes screening. This is done by checking your blood sugar (glucose) after you have not eaten for a while (fasting). You may have this done every 1-3 years.  Mammogram. This may be done every 1-2 years. Talk with your health care provider about how often you should have regular mammograms.  BRCA-related cancer screening. This may be done if you have a family history of breast, ovarian, tubal, or peritoneal cancers.  Other tests  Sexually transmitted disease (STD) testing.  Bone density scan. This is done to screen for osteoporosis. You may have this done starting at age 69. Follow these instructions at home: Eating and drinking  Eat a diet that includes fresh fruits and vegetables, whole grains, lean protein, and low-fat dairy products. Limit  your intake of foods with high amounts of sugar, saturated fats, and salt.  Take vitamin and mineral supplements as recommended by your health care provider.  Do not drink alcohol if your health care provider tells you not to drink.  If you drink alcohol: ? Limit how much you have to 0-1 drink a day. ? Be aware of how much alcohol is in your drink. In the [place] one drink equals one 12 oz bottle of beer (355 mL), one 5 oz glass of wine (148 mL), or one 1 oz glass of hard liquor (44 mL). Lifestyle  Take daily care of your teeth and gums.  Stay active. Exercise for at least 30 minutes on 5 or more days each week.  Do not use any products that contain nicotine or tobacco, such as cigarettes, e-cigarettes, and chewing tobacco. If you need help quitting, ask your health care provider.  If you are sexually active, practice safe sex. Use a condom or other form of protection in order to prevent STIs (sexually transmitted infections).  Talk with your health care provider about taking a low-dose aspirin or statin. What's next?  Go to your health care provider once a year for a well check visit.  Ask your health care provider how often you should have your eyes and teeth checked.  Stay up to date on all vaccines. This information is not intended to replace advice given to you by your health care provider. Make sure you discuss any questions you have with your health care provider. Document Revised: 11/03/2018 Document Reviewed: 11/03/2018 Elsevier Patient Education  2020 [employer].

## 2020-03-28 NOTE — Progress Notes (Signed)
Complete physical exam   Patient: Lacey Jordan   DOB: [birth year]   69 y.o. Female  MRN: [id number] Visit Date: 03/28/2020  I,Sulibeya S Dimas,acting as a scribe for Lavon Paganini, MD.,have documented all relevant documentation on the behalf of Lavon Paganini, MD,as directed by  Lavon Paganini, MD while in the presence of Lavon Paganini, MD.  Today's healthcare provider: Lavon Paganini, MD   Chief Complaint  Patient presents with  . Annual Exam   Subjective    Lacey Jordan is a 69 y.o. female who presents today for a complete physical exam.  She reports consuming a general diet. The patient does not participate in regular exercise at present. She generally feels well. She reports sleeping well. She does not have additional problems to discuss today.  HPI    Past Medical History:  Diagnosis Date  . Arthritis 2009  . Breast cancer [facility]) 2011   right breast with mammosite  . Bronchitis   . Cancer [facility]) 2011   right breast, L/SN/R stage 1 cancer ER/PR pos HER 2 negative  . Colitis 1980  . Malignant neoplasm of lower-outer quadrant of female breast ([place]) 2011  . Special screening for malignant neoplasms, colon   . Ulcer 1969   stomach  . Wrist fracture    age of 68   Past Surgical History:  Procedure Laterality Date  . BREAST BIOPSY Right 2011   invasive mammary carcinoma  . BREAST BIOPSY Right 07/09/2014   fat necrosis  . BREAST EXCISIONAL BIOPSY Right    age of 69 - neg  . BREAST LUMPECTOMY Right 2011   right breast invasive mammary carcinoma  . BREAST LUMPECTOMY WITH SENTINEL LYMPH NODE BIOPSY Right 2011  . BREAST MAMMOSITE Right 2011  . BREAST SURGERY Right 2011   stereo  . COLONOSCOPY  2009  . FRACTURE SURGERY Left 2009  . TONSILLECTOMY     age of 55 yrs  . TUBAL LIGATION  1986   Social History   Socioeconomic History  . Marital status: Married    Spouse name: Kyung Rudd  . Number of children: 2  . Years of education: 42  . Highest  education level: Some college, no degree  Occupational History  . Occupation: retired  Tobacco Use  . Smoking status: Former Smoker    Packs/day: 1.00    Years: 20.00    Pack years: 20.00    Types: Cigarettes    Quit date: 06/12/2001    Years since quitting: 18.8  . Smokeless tobacco: Never Used  . Tobacco comment: quit in 2002  Substance and Sexual Activity  . Alcohol use: Yes    Comment: wine- once per month  . Drug use: No  . Sexual activity: Not on file  Other Topics Concern  . Not on file  Social History Narrative  . Not on file   Social Determinants of Health   Financial Resource Strain: Low Risk   . Difficulty of Paying Living Expenses: Not hard at all  Food Insecurity: No Food Insecurity  . Worried About Charity fundraiser in the Last Year: Never true  . Ran Out of Food in the Last Year: Never true  Transportation Needs: No Transportation Needs  . Lack of Transportation (Medical): No  . Lack of Transportation (Non-Medical): No  Physical Activity: Inactive  . Days of Exercise per Week: 0 days  . Minutes of Exercise per Session: 0 min  Stress: No Stress Concern Present  . Feeling of Stress :  Not at all  Social Connections: Moderately Isolated  . Frequency of Communication with Friends and Family: Never  . Frequency of Social Gatherings with Friends and Family: Never  . Attends Religious Services: Never  . Active Member of Clubs or Organizations: No  . Attends Archivist Meetings: Never  . Marital Status: Married  Human resources officer Violence: Not At Risk  . Fear of Current or Ex-Partner: No  . Emotionally Abused: No  . Physically Abused: No  . Sexually Abused: No   Family Status  Relation Name Status  . Ethlyn Daniels  (Not Specified)  . Cousin  (Not Specified)  . Cousin  (Not Specified)  . Mother  Deceased  . Father  Deceased  . Daughter  Alive  . Son  Deceased       comitted sucide, had substance abuse   Family History  Problem Relation Age of  Onset  . Breast cancer Paternal Aunt 42  . Breast cancer Cousin   . Breast cancer Cousin   . Diabetes Mother   . Obesity Son    Allergies  Allergen Reactions  . Vitamin B12 Itching and Other (See Comments)  . Adhesive [Tape] Rash    Band aid sensitivity  . Nickel Rash    Patient Care Team: Virginia Crews, MD as PCP - General (Family Medicine)   Medications: Outpatient Medications Prior to Visit  Medication Sig  . Calcium-Magnesium-Zinc 500-250-12.5 MG TABS Take 1 tablet by mouth daily.   . Cholecalciferol (VITAMIN D3) 125 MCG (5000 UT) TABS Take 5,000 Units by mouth daily.   Marland Kitchen desoximetasone (TOPICORT) 0.25 % cream Apply 1 application topically 2 (two) times daily. (Patient taking differently: Apply 1 application topically 2 (two) times daily. As needed)  . ibuprofen (ADVIL,MOTRIN) 200 MG tablet Take 200 mg by mouth every 6 (six) hours as needed.  . Melatonin 3 MG TABS Take 3 mg by mouth at bedtime as needed.  . Multiple Vitamin (MULTIVITAMIN) tablet Take 1 tablet by mouth daily.   . Omega 3 1200 MG CAPS Take 1,200 mg by mouth daily.   . Thiamine HCl (VITAMIN B-1) 250 MG tablet Take 250 mg by mouth daily.   . Zinc Picolinate 25 MG TABS Take 25 mg by mouth 3 (three) times a week.    No facility-administered medications prior to visit.    Review of Systems  Constitutional: Negative.   HENT: Negative.   Eyes: Negative.   Respiratory: Negative.   Cardiovascular: Negative.   Gastrointestinal: Negative.   Endocrine: Negative.   Genitourinary: Positive for frequency. Negative for decreased urine volume, difficulty urinating, flank pain, hematuria, pelvic pain, urgency, vaginal bleeding, vaginal discharge and vaginal pain.  Musculoskeletal: Positive for back pain and myalgias.  Skin: Negative.   Allergic/Immunologic: Positive for environmental allergies.  Neurological: Negative.   Hematological: Negative.   Psychiatric/Behavioral: Negative.     Last metabolic  panel Lab Results  Component Value Date   GLUCOSE 84 10/10/2018   NA 139 10/10/2018   K 4.3 10/10/2018   CL 103 10/10/2018   CO2 20 10/10/2018   BUN 14 10/10/2018   CREATININE 0.78 10/10/2018   GFRNONAA 79 10/10/2018   GFRAA 91 10/10/2018   CALCIUM 9.9 10/10/2018   PROT 6.8 10/10/2018   ALBUMIN 4.3 10/10/2018   LABGLOB 2.5 10/10/2018   AGRATIO 1.7 10/10/2018   BILITOT 0.3 10/10/2018   ALKPHOS 70 10/10/2018   AST 26 10/10/2018   ALT 28 10/10/2018   Last lipids Lab Results  Component Value Date   CHOL 196 10/10/2018   HDL 46 10/10/2018   LDLCALC 129 (H) 10/10/2018   TRIG 104 10/10/2018   CHOLHDL 4.3 10/10/2018      Objective    BP (!) 150/90 (BP Location: Right Arm, Patient Position: Sitting, Cuff Size: Normal)   Pulse 74   Temp (!) 97.1 F (36.2 C) (Temporal)   Resp 16   Ht 5' 5.5" (1.664 m)   Wt 158 lb (71.7 kg)   BMI 25.89 kg/m  BP Readings from Last 3 Encounters:  03/28/20 (!) 150/90  10/10/18 (!) 157/89  10/07/17 116/80   Wt Readings from Last 3 Encounters:  03/28/20 158 lb (71.7 kg)  10/10/18 158 lb 9.6 oz (71.9 kg)  10/07/17 154 lb (69.9 kg)      Physical Exam Vitals reviewed.  Constitutional:      General: She is not in acute distress.    Appearance: Normal appearance. She is well-developed. She is not diaphoretic.  HENT:     Head: Normocephalic and atraumatic.     Right Ear: Tympanic membrane, ear canal and external ear normal.     Left Ear: Tympanic membrane, ear canal and external ear normal.  Eyes:     General: No scleral icterus.    Conjunctiva/sclera: Conjunctivae normal.     Pupils: Pupils are equal, round, and reactive to light.  Neck:     Thyroid: No thyromegaly.  Cardiovascular:     Rate and Rhythm: Normal rate and regular rhythm.     Pulses: Normal pulses.     Heart sounds: Normal heart sounds. No murmur.  Pulmonary:     Effort: Pulmonary effort is normal. No respiratory distress.     Breath sounds: Normal breath sounds. No  wheezing or rales.  Abdominal:     General: There is no distension.     Palpations: Abdomen is soft.     Tenderness: There is no abdominal tenderness. There is no guarding or rebound.  Musculoskeletal:        General: No deformity.     Cervical back: Neck supple.     Right lower leg: No edema.     Left lower leg: No edema.  Lymphadenopathy:     Cervical: No cervical adenopathy.  Skin:    General: Skin is warm and dry.     Findings: No rash.  Neurological:     Mental Status: She is alert and oriented to person, place, and time. Mental status is at baseline.  Psychiatric:        Mood and Affect: Mood normal.        Behavior: Behavior normal.        Thought Content: Thought content normal.     Depression Screen  PHQ 2/9 Scores 01/18/2020 10/10/2018 10/07/2017  PHQ - 2 Score 1 6 2   PHQ- 9 Score - 15 7    No results found for any visits on 03/28/20.  Assessment & Plan    Routine Health Maintenance and Physical Exam  Exercise Activities and Dietary recommendations Goals    . Exercise 3x per week (30 min per time)     Recommend to exercise for 3 days a week for at least 30 minutes at a time.     . Increase water intake     Recommend increasing water intake to 3 glasses a day.        Immunization History  Administered Date(s) Administered  . Influenza, High Dose Seasonal PF 08/26/2017, 10/10/2018  .  PFIZER SARS-COV-2 Vaccination 01/19/2020, 02/14/2020  . Pneumococcal Conjugate-13 08/26/2017  . Pneumococcal Polysaccharide-23 10/10/2018  . Tdap 10/05/2011  . Zoster Recombinat (Shingrix) 07/15/2018, 05/23/2019    Health Maintenance  Topic Date Due  . INFLUENZA VACCINE  06/23/2020  . MAMMOGRAM  07/06/2020  . DEXA SCAN  10/24/2020  . TETANUS/TDAP  10/04/2021  . COLONOSCOPY  01/22/2025  . COVID-19 Vaccine  Completed  . Hepatitis C Screening  Completed  . PNA vac Low Risk Adult  Completed    Discussed health benefits of physical activity, and encouraged her to  engage in regular exercise appropriate for her age and condition.  Problem List Items Addressed This Visit      Musculoskeletal and Integument   Osteopenia    Check vitamin D today Discussed maintaining healthy weight, and regular weightbearing exercise Recheck DEXA next year      Relevant Orders   VITAMIN D 25 Hydroxy (Vit-D Deficiency, Fractures)     Other   Hyperlipidemia    Reviewed last lipid panel Not currently on a statin Recheck FLP and CMP Discussed diet and exercise       Relevant Orders   Comprehensive metabolic panel   Lipid Panel With LDL/HDL Ratio   Overweight    Discussed importance of healthy weight management Discussed diet and exercise       Relevant Orders   Comprehensive metabolic panel   Lipid Panel With LDL/HDL Ratio    Other Visit Diagnoses    Annual physical exam    -  Primary   Relevant Orders   Comprehensive metabolic panel   Lipid Panel With LDL/HDL Ratio   MM 3D SCREEN BREAST BILATERAL   Encounter for screening mammogram for malignant neoplasm of breast       Relevant Orders   MM 3D SCREEN BREAST BILATERAL       Return in about 1 year (around 03/28/2021) for CPE.     I, Lavon Paganini, MD, have reviewed all documentation for this visit. The documentation on 03/28/20 for the exam, diagnosis, procedures, and orders are all accurate and complete.   [place] Kirchner, Dionne Bucy, MD, MPH [facility]

## 2020-03-28 NOTE — Assessment & Plan Note (Signed)
Check vitamin D today Discussed maintaining healthy weight, and regular weightbearing exercise Recheck DEXA next year

## 2020-03-28 NOTE — Assessment & Plan Note (Signed)
Discussed importance of healthy weight management Discussed diet and exercise  

## 2020-03-28 NOTE — Assessment & Plan Note (Signed)
Reviewed last lipid panel Not currently on a statin Recheck FLP and CMP Discussed diet and exercise  

## 2020-03-29 ENCOUNTER — Telehealth: Payer: Self-pay

## 2020-03-29 LAB — COMPREHENSIVE METABOLIC PANEL
ALT: 31 IU/L (ref 0–32)
AST: 24 IU/L (ref 0–40)
Albumin/Globulin Ratio: 1.8 (ref 1.2–2.2)
Albumin: 4.4 g/dL (ref 3.8–4.8)
Alkaline Phosphatase: 72 IU/L (ref 39–117)
BUN/Creatinine Ratio: 16 (ref 12–28)
BUN: 12 mg/dL (ref 8–27)
Bilirubin Total: 0.4 mg/dL (ref 0.0–1.2)
CO2: 22 mmol/L (ref 20–29)
Calcium: 9.6 mg/dL (ref 8.7–10.3)
Chloride: 103 mmol/L (ref 96–106)
Creatinine, Ser: 0.74 mg/dL (ref 0.57–1.00)
GFR calc Af Amer: 96 mL/min/{1.73_m2} (ref >59)
GFR calc non Af Amer: 84 mL/min/{1.73_m2} (ref >59)
Globulin, Total: 2.5 g/dL (ref 1.5–4.5)
Glucose: 81 mg/dL (ref 65–99)
Potassium: 4.2 mmol/L (ref 3.5–5.2)
Sodium: 141 mmol/L (ref 134–144)
Total Protein: 6.9 g/dL (ref 6.0–8.5)

## 2020-03-29 LAB — LIPID PANEL WITH LDL/HDL RATIO
Cholesterol, Total: 197 mg/dL (ref 100–199)
HDL: 46 mg/dL (ref >39)
LDL Chol Calc (NIH): 129 mg/dL — ABNORMAL HIGH (ref 0–99)
LDL/HDL Ratio: 2.8 ratio (ref 0.0–3.2)
Triglycerides: 122 mg/dL (ref 0–149)
VLDL Cholesterol Cal: 22 mg/dL (ref 5–40)

## 2020-03-29 LAB — VITAMIN D 25 HYDROXY (VIT D DEFICIENCY, FRACTURES): Vit D, 25-Hydroxy: 92.7 ng/mL (ref 30.0–100.0)

## 2020-03-29 NOTE — Telephone Encounter (Signed)
-----   Message from Virginia Crews, MD sent at 03/29/2020 10:36 AM EDT ----- Normal labs

## 2020-03-29 NOTE — Telephone Encounter (Signed)
Patient advised.

## 2020-04-04 ENCOUNTER — Encounter: Payer: Self-pay | Admitting: Family Medicine

## 2020-05-16 ENCOUNTER — Encounter: Payer: Self-pay | Admitting: Family Medicine

## 2020-06-12 ENCOUNTER — Encounter: Payer: Self-pay | Admitting: Family Medicine

## 2020-06-14 ENCOUNTER — Ambulatory Visit
Admission: RE | Admit: 2020-06-14 | Discharge: 2020-06-14 | Disposition: A | Discharge status: Home / Self Care | Payer: Medicare HMO | Source: Ambulatory Visit | Attending: Adult Health | Admitting: Adult Health

## 2020-06-14 ENCOUNTER — Encounter: Payer: Self-pay | Admitting: Adult Health

## 2020-06-14 ENCOUNTER — Ambulatory Visit
Admission: RE | Admit: 2020-06-14 | Discharge: 2020-06-14 | Disposition: A | Discharge status: Home / Self Care | Payer: Medicare HMO | Attending: Adult Health | Admitting: Adult Health

## 2020-06-14 ENCOUNTER — Ambulatory Visit (INDEPENDENT_AMBULATORY_CARE_PROVIDER_SITE_OTHER): Payer: Medicare HMO | Admitting: Adult Health

## 2020-06-14 ENCOUNTER — Other Ambulatory Visit: Payer: Self-pay

## 2020-06-14 VITALS — BP 134/80 | HR 93 | Temp 96.6°F | Resp 15 | Wt 154.8 lb

## 2020-06-14 DIAGNOSIS — K5900 Constipation, unspecified: Secondary | ICD-10-CM

## 2020-06-14 NOTE — Patient Instructions (Addendum)
Increase Hydration.  Colace tablets per package instructions daily.   Miralax per package instructions daily until relief.  Call if no bowel movement in 3 to 4 days or if any symptoms worsen.   Start metamucil or psyllium husks in 8 ounces of liquid nightly to help with fiber intake.    Constipation, Adult Constipation is when a person:  Poops (has a bowel movement) fewer times in a week than normal.  Has a hard time pooping.  Has poop that is dry, hard, or bigger than normal. Follow these instructions at home: Eating and drinking   Eat foods that have a lot of fiber, such as: ? Fresh fruits and vegetables. ? Whole grains. ? Beans.  Eat less of foods that are high in fat, low in fiber, or overly processed, such as: ? [place] fries. ? Hamburgers. ? Cookies. ? Candy. ? Soda.  Drink enough fluid to keep your pee (urine) clear or pale yellow. General instructions  Exercise regularly or as told by your doctor.  Go to the restroom when you feel like you need to poop. Do not hold it in.  Take over-the-counter and prescription medicines only as told by your doctor. These include any fiber supplements.  Do pelvic floor retraining exercises, such as: ? Doing deep breathing while relaxing your lower belly (abdomen). ? Relaxing your pelvic floor while pooping.  Watch your condition for any changes.  Keep all follow-up visits as told by your doctor. This is important. Contact a doctor if:  You have pain that gets worse.  You have a fever.  You have not pooped for 4 days.  You throw up (vomit).  You are not hungry.  You lose weight.  You are bleeding from the anus.  You have thin, pencil-like poop (stool). Get help right away if:  You have a fever, and your symptoms suddenly get worse.  You leak poop or have blood in your poop.  Your belly feels hard or bigger than normal (is bloated).  You have very bad belly pain.  You feel dizzy or you faint. This  information is not intended to replace advice given to you by your health care provider. Make sure you discuss any questions you have with your health care provider. Document Revised: 10/22/2017 Document Reviewed: 04/29/2016 Elsevier Patient Education  [place]. Polyethylene Glycol powder What is this medicine? POLYETHYLENE GLYCOL 3350 (pol ee ETH i leen; GLYE col) powder is a laxative used to treat constipation. It increases the amount of water in the stool. Bowel movements become easier and more frequent. This medicine may be used for other purposes; ask your health care provider or pharmacist if you have questions. COMMON BRAND NAME(S): Sharlyn Bologna, GlycoLax, Healthylax, MiraLax, Smooth LAX, Vita Health What should I tell my health care provider before I take this medicine? They need to know if you have any of these conditions:  a history of blockage of the stomach or intestine  current abdomen distension or pain  difficulty swallowing  diverticulitis, ulcerative colitis, or other chronic bowel disease  phenylketonuria  an unusual or allergic reaction to polyethylene glycol, other medicines, dyes, or preservatives  pregnant or trying to get pregnant  breast-feeding How should I use this medicine? Take this medicine by mouth. The bottle has a measuring cap that is marked with a line. Pour the powder into the cap up to the marked line (the dose is about 1 heaping tablespoon). Add the powder in the cap to a full  glass (4 to 8 ounces or 120 to 240 mL) of water, juice, soda, coffee or tea. Mix the powder well. Ensure that the powder is fully dissolved. Do not drink if there are any clumps. Drink the solution. Take exactly as directed. Do not take your medicine more often than directed. Talk to your pediatrician regarding the use of this medicine in children. Special care may be needed. Overdosage: If you think you have taken too much of this medicine contact a poison control  center or emergency room at once. NOTE: This medicine is only for you. Do not share this medicine with others. What if I miss a dose? If you miss a dose, take it as soon as you can. If it is almost time for your next dose, take only that dose. Do not take double or extra doses. What may interact with this medicine? Interactions are not expected. This list may not describe all possible interactions. Give your health care provider a list of all the medicines, herbs, non-prescription drugs, or dietary supplements you use. Also tell them if you smoke, drink alcohol, or use illegal drugs. Some items may interact with your medicine. What should I watch for while using this medicine? Do not use for more than 2 weeks without advice from your doctor or health care professional. It can take 2 to 4 days to have a bowel movement and to experience improvement in constipation. See your health care professional for any changes in bowel habits, including constipation, that are severe or last longer than three weeks. Always take this medicine with plenty of water. What side effects may I notice from receiving this medicine? Side effects that you should report to your doctor or health care professional as soon as possible:  diarrhea  difficulty breathing  itching of the skin, hives, or skin rash  severe bloating, pain, or distension of the stomach  vomiting Side effects that usually do not require medical attention (report to your doctor or health care professional if they continue or are bothersome):  bloating or gas  lower abdominal discomfort or cramps  nausea This list may not describe all possible side effects. Call your doctor for medical advice about side effects. You may report side effects to FDA at 1-800-FDA-1088. Where should I keep my medicine? Keep out of the reach of children. Store between 15 and 30 degrees C (59 and 86 degrees F). Throw away any unused medicine after the expiration  date. NOTE: This sheet is a summary. It may not cover all possible information. If you have questions about this medicine, talk to your doctor, pharmacist, or health care provider.  2020 Elsevier/Gold Standard (2018-04-28 10:42:01) Psyllium granules or powder for solution What is this medicine? PSYLLIUM (SIL i yum) is a bulk-forming fiber laxative. This medicine is used to treat constipation. Increasing fiber in the diet may also help lower cholesterol and promote heart health for some people. This medicine may be used for other purposes; ask your health care provider or pharmacist if you have questions. COMMON BRAND NAME(S): Fiber Therapy, GenFiber, Geri-Mucil, Hydrocil, Konsyl, Metamucil, Metamucil MultiHealth, Mucilin, Natural Fiber Therapy, Reguloid What should I tell my health care provider before I take this medicine? They need to know if you have any of these conditions:  blockage in your bowel  difficulty swallowing  inflammatory bowel disease  phenylketonuria  stomach or intestine problems  sudden change in bowel habits lasting more than 2 weeks  an unusual or allergic reaction to psyllium, other  medicines, dyes, or preservatives  pregnant or trying or get pregnant  breast-feeding How should I use this medicine? Mix this medicine into a full glass (240 mL) of water or other cool drink. Take this medicine by mouth. Follow the directions on the package labeling, or take as directed by your health care professional. Take your medicine at regular intervals. Do not take your medicine more often than directed. Talk to your pediatrician regarding the use of this medicine in children. While this drug may be prescribed for children as young as 68 years old for selected conditions, precautions do apply. Overdosage: If you think you have taken too much of this medicine contact a poison control center or emergency room at once. NOTE: This medicine is only for you. Do not share this  medicine with others. What if I miss a dose? If you miss a dose, take it as soon as you can. If it is almost time for your next dose, take only that dose. Do not take double or extra doses. What may interact with this medicine? Interactions are not expected. Take this product at least 2 hours before or after other medicines. This list may not describe all possible interactions. Give your health care provider a list of all the medicines, herbs, non-prescription drugs, or dietary supplements you use. Also tell them if you smoke, drink alcohol, or use illegal drugs. Some items may interact with your medicine. What should I watch for while using this medicine? Check with your doctor or health care professional if your symptoms do not start to get better or if they get worse. Stop using this medicine and contact your doctor or health care professional if you have rectal bleeding or if you have to treat your constipation for more than 1 week. These could be signs of a more serious condition. Drink several glasses of water a day while you are taking this medicine. This will help to relieve constipation and prevent dehydration. What side effects may I notice from receiving this medicine? Side effects that you should report to your doctor or health care professional as soon as possible:  allergic reactions like skin rash, itching or hives, swelling of the face, lips, or tongue  breathing problems  chest pain  nausea, vomiting  rectal bleeding  trouble swallowing Side effects that usually do not require medical attention (report to your doctor or health care professional if they continue or are bothersome):  bloating  gas  stomach cramps This list may not describe all possible side effects. Call your doctor for medical advice about side effects. You may report side effects to FDA at 1-800-FDA-1088. Where should I keep my medicine? Keep out of the reach of children. Store at room temperature  between 15 and 30 degrees C (59 and 86 degrees F). Protect from moisture. Throw away any unused medicine after the expiration date. NOTE: This sheet is a summary. It may not cover all possible information. If you have questions about this medicine, talk to your doctor, pharmacist, or health care provider.  2020 Elsevier/Gold Standard (2018-04-05 15:41:08)

## 2020-06-14 NOTE — Progress Notes (Signed)
Established patient visit   Patient: Lacey Jordan   DOB: December 30, 1950   69 y.o. Female  MRN: [id number] Visit Date: 06/14/2020  Today's healthcare provider: Marcille Buffy, FNP   Chief Complaint  Patient presents with  . Constipation   Subjective    Constipation This is a new problem. The current episode started in the past 7 days. The problem is unchanged. The stool is described as pellet like. The patient is not on a high fiber diet. She does not exercise regularly. There has been adequate water intake. Associated symptoms include bloating, flatus and rectal pain. Pertinent negatives include no abdominal pain, anorexia, back pain, diarrhea, difficulty urinating, fecal incontinence, fever, hematochezia, hemorrhoids, melena, nausea, vomiting or weight loss. She has tried stool softeners for the symptoms. The treatment provided no relief. There is no history of abdominal surgery, endocrine disease, inflammatory bowel disease, irritable bowel syndrome or metabolic disease.    She tried a couple of stool softeners a few days ago.   She had a small bowel movement today and it is improved since yesterday/. Denies any rectal pain, or dark/ bloody stools.  Mild cramping lower abdomen.  She denies any significant pain in her abdomen. Bloating abdomen.   Patient  denies any fever, body aches,chills, rash, chest pain, shortness of breath, nausea, vomiting, or diarrhea.   Colonoscopy 2016 had polyps removed.   History left breast cancer - she does not want another mammogram she reports.    Patient Active Problem List   Diagnosis Date Noted  . Hyperlipidemia 03/28/2020  . Overweight 03/28/2020  . Adjustment disorder with depressed mood 10/07/2017  . Osteopenia 10/07/2017  . Hearing decreased, left 10/07/2017  . Low back pain 10/07/2017  . History of breast cancer 07/18/2013   Past Medical History:  Diagnosis Date  . Arthritis 2009  . Breast cancer [facility]) 2011   right  breast with mammosite  . Bronchitis   . Cancer [facility]) 2011   right breast, L/SN/R stage 1 cancer ER/PR pos HER 2 negative  . Colitis 1980  . Malignant neoplasm of lower-outer quadrant of female breast ([place]) 2011  . Special screening for malignant neoplasms, colon   . Ulcer 1969   stomach  . Wrist fracture    age of 38   Social History   Tobacco Use  . Smoking status: Former Smoker    Packs/day: 1.00    Years: 20.00    Pack years: 20.00    Types: Cigarettes    Quit date: 06/12/2001    Years since quitting: 19.0  . Smokeless tobacco: Never Used  . Tobacco comment: quit in 2002  Vaping Use  . Vaping Use: Never used  Substance Use Topics  . Alcohol use: Yes    Comment: wine- once per month  . Drug use: No   Allergies  Allergen Reactions  . Vitamin B12 Itching and Other (See Comments)  . Adhesive [Tape] Rash    Band aid sensitivity  . Nickel Rash       Medications: Outpatient Medications Prior to Visit  Medication Sig  . Calcium-Magnesium-Zinc 500-250-12.5 MG TABS Take 1 tablet by mouth daily.  (Patient not taking: Reported on 06/14/2020)  . Cholecalciferol (VITAMIN D3) 125 MCG (5000 UT) TABS Take 5,000 Units by mouth daily.  (Patient not taking: Reported on 06/14/2020)  . desoximetasone (TOPICORT) 0.25 % cream Apply 1 application topically 2 (two) times daily. (Patient not taking: Reported on 06/14/2020)  . ibuprofen (ADVIL,MOTRIN) 200 MG tablet  Take 200 mg by mouth every 6 (six) hours as needed. (Patient not taking: Reported on 06/14/2020)  . Melatonin 3 MG TABS Take 3 mg by mouth at bedtime as needed. (Patient not taking: Reported on 06/14/2020)  . Multiple Vitamin (MULTIVITAMIN) tablet Take 1 tablet by mouth daily.  (Patient not taking: Reported on 06/14/2020)  . Omega 3 1200 MG CAPS Take 1,200 mg by mouth daily.  (Patient not taking: Reported on 06/14/2020)  . Thiamine HCl (VITAMIN B-1) 250 MG tablet Take 250 mg by mouth daily.  (Patient not taking: Reported on 06/14/2020)  .  Zinc Picolinate 25 MG TABS Take 25 mg by mouth 3 (three) times a week.  (Patient not taking: Reported on 06/14/2020)   No facility-administered medications prior to visit.    Review of Systems  Constitutional: Negative.  Negative for fever and weight loss.  HENT: Negative.   Respiratory: Negative.   Cardiovascular: Negative.   Gastrointestinal: Positive for abdominal distention, bloating, constipation, flatus and rectal pain. Negative for abdominal pain, anorexia, diarrhea, hematochezia, hemorrhoids, melena, nausea and vomiting.  Genitourinary: Negative.  Negative for difficulty urinating.  Musculoskeletal: Negative.  Negative for back pain.  Skin: Negative.   Neurological: Negative.   Hematological: Negative.   Psychiatric/Behavioral: Negative.       Objective    BP (!) 134/80   Pulse 93   Temp (!) 96.6 F (35.9 C) (Oral)   Resp 15   Wt 154 lb 12.8 oz (70.2 kg)   SpO2 96%   BMI 25.37 kg/m    Physical Exam Vitals reviewed.  Constitutional:      General: She is not in acute distress.    Appearance: She is well-developed. She is not diaphoretic.     Interventions: She is not intubated.    Comments: Patient is alert and oriented and responsive to questions Engages in eye contact with provider. Speaks in full sentences without any pauses without any shortness of breath or distress.    HENT:     Head: Normocephalic and atraumatic.     Right Ear: External ear normal.     Left Ear: External ear normal.     Nose: Nose normal.     Mouth/Throat:     Pharynx: No oropharyngeal exudate.  Eyes:     General: Lids are normal. No scleral icterus.       Right eye: No discharge.        Left eye: No discharge.     Conjunctiva/sclera: Conjunctivae normal.     Right eye: Right conjunctiva is not injected. No exudate or hemorrhage.    Left eye: Left conjunctiva is not injected. No exudate or hemorrhage.    Pupils: Pupils are equal, round, and reactive to light.  Neck:     Thyroid: No  thyroid mass or thyromegaly.     Vascular: Normal carotid pulses. No carotid bruit, hepatojugular reflux or JVD.     Trachea: Trachea and phonation normal. No tracheal tenderness or tracheal deviation.     Meningeal: Brudzinski's sign and Kernig's sign absent.  Cardiovascular:     Rate and Rhythm: Normal rate and regular rhythm.     Pulses: Normal pulses.          Radial pulses are 2+ on the right side and 2+ on the left side.       Dorsalis pedis pulses are 2+ on the right side and 2+ on the left side.       Posterior tibial pulses are 2+ on  the right side and 2+ on the left side.     Heart sounds: Normal heart sounds, S1 normal and S2 normal. Heart sounds not distant. No murmur heard.  No friction rub. No gallop.   Pulmonary:     Effort: Pulmonary effort is normal. No tachypnea, bradypnea, accessory muscle usage or respiratory distress. She is not intubated.     Breath sounds: Normal breath sounds. No stridor. No wheezing or rales.  Chest:     Chest wall: No tenderness.  Abdominal:     General: Bowel sounds are normal. There is no distension or abdominal bruit.     Palpations: Abdomen is soft. There is no shifting dullness, fluid wave, hepatomegaly, splenomegaly, mass or pulsatile mass.     Tenderness: There is no abdominal tenderness. There is no guarding or rebound.     Hernia: No hernia is present.  Musculoskeletal:        General: No tenderness or deformity. Normal range of motion.     Cervical back: Full passive range of motion without pain, normal range of motion and neck supple. No edema, erythema or rigidity. No spinous process tenderness or muscular tenderness. Normal range of motion.  Lymphadenopathy:     Head:     Right side of head: No submental, submandibular, tonsillar, preauricular, posterior auricular or occipital adenopathy.     Left side of head: No submental, submandibular, tonsillar, preauricular, posterior auricular or occipital adenopathy.     Cervical: No  cervical adenopathy.     Right cervical: No superficial, deep or posterior cervical adenopathy.    Left cervical: No superficial, deep or posterior cervical adenopathy.     Upper Body:     Right upper body: No supraclavicular or pectoral adenopathy.     Left upper body: No supraclavicular or pectoral adenopathy.  Skin:    General: Skin is warm and dry.     Coloration: Skin is not pale.     Findings: No abrasion, bruising, burn, ecchymosis, erythema, lesion, petechiae or rash.     Nails: There is no clubbing.  Neurological:     Mental Status: She is alert and oriented to person, place, and time.     GCS: GCS eye subscore is 4. GCS verbal subscore is 5. GCS motor subscore is 6.     Cranial Nerves: No cranial nerve deficit.     Sensory: No sensory deficit.     Motor: No tremor, atrophy, abnormal muscle tone or seizure activity.     Coordination: Coordination normal.     Gait: Gait normal.     Deep Tendon Reflexes: Reflexes normal. Babinski sign absent on the right side. Babinski sign absent on the left side.     Reflex Scores:      Tricep reflexes are 2+ on the left side.      Bicep reflexes are 2+ on the left side.      Brachioradialis reflexes are 2+ on the left side.      Patellar reflexes are 2+ on the left side.      Achilles reflexes are 2+ on the left side. Psychiatric:        Speech: Speech normal.        Behavior: Behavior normal. Behavior is cooperative.        Thought Content: Thought content normal.        Judgment: Judgment normal.       No results found for any visits on 06/14/20.  Assessment & Plan  Constipation, unspecified constipation type - Plan: DG Abd 1 View  Patient will start MiraLAX per package instructions up to twice a day.  Increase fiber with Metamucil.  Continue Colace stool softener as needed.  Seek care immediately if any pain worsens.  Okay to use enema if needed.  Follow-up for routine health maintenance. Return in 2 weeks (on 06/28/2020), or  if symptoms worsen or fail to improve, for at any time for any worsening symptoms.    Advised patient call the office or your primary care doctor for an appointment if no improvement within 72 hours or if any symptoms change or worsen at any time  Advised ER or urgent Care if after hours or on weekend. Call 911 for emergency symptoms at any time.Patinet verbalized understanding of all instructions given/reviewed and treatment plan and has no further questions or concerns at this time.      IWellington Hampshire Braidyn Peace, FNP, have reviewed all documentation for this visit. The documentation on 06/16/20 for the exam, diagnosis, procedures, and orders are all accurate and complete.    Marcille Buffy, [place] [phone] (phone) [phone] (fax)  [place]

## 2020-06-16 NOTE — Progress Notes (Signed)
Small amount of constipation in the colon, patient was advised on treatment in the office and has a follow-up in 2 weeks that it is advised that she keep that appointment.

## 2020-07-05 ENCOUNTER — Ambulatory Visit: Payer: Self-pay | Admitting: Adult Health

## 2020-07-05 ENCOUNTER — Ambulatory Visit: Payer: Self-pay | Admitting: Family Medicine

## 2020-07-09 ENCOUNTER — Encounter: Payer: Self-pay | Admitting: Family Medicine

## 2021-01-21 NOTE — Progress Notes (Signed)
Subjective:   Livian Vanderbeck is a 70 y.o. female who presents for Medicare Annual (Subsequent) preventive examination.  I connected with Alex Gardener today by telephone and verified that I am speaking with the correct person using two identifiers. Location patient: home Location provider: work Persons participating in the virtual visit: patient, provider.   I discussed the limitations, risks, security and privacy concerns of performing an evaluation and management service by telephone and the availability of in person appointments. I also discussed with the patient that there may be a patient responsible charge related to this service. The patient expressed understanding and verbally consented to this telephonic visit.    Interactive audio and video telecommunications were attempted between this provider and patient, however failed, due to patient having technical difficulties OR patient did not have access to video capability.  We continued and completed visit with audio only.   Review of Systems    N/A  Cardiac Risk Factors include: advanced age (>58men, >54 women)     Objective:    There were no vitals filed for this visit. There is no height or weight on file to calculate BMI.  Advanced Directives 01/22/2021 01/18/2020 08/26/2017  Does Patient Have a Medical Advance Directive? No Yes Yes  Type of Advance Directive - [place];Living will [place];Living will  Copy of [place] in Chart? - No - copy requested No - copy requested  Would patient like information on creating a medical advance directive? No - Patient declined - -    Current Medications (verified) Outpatient Encounter Medications as of 01/22/2021  Medication Sig  . Cholecalciferol (VITAMIN D3) 125 MCG (5000 UT) TABS Take 5,000 Units by mouth daily.  Marland Kitchen ibuprofen (ADVIL,MOTRIN) 200 MG tablet Take 200 mg by mouth every 6 (six) hours as needed.  . Melatonin 3  MG TABS Take 3 mg by mouth at bedtime as needed.  . Menaquinone-7 (VITAMIN K2) 100 MCG CAPS Take 100 mcg by mouth daily at 6 (six) AM.  . Multiple Vitamin (MULTIVITAMIN) tablet Take 1 tablet by mouth daily.  . Omega 3 1200 MG CAPS Take 1,200 mg by mouth daily.  . psyllium (REGULOID) 0.52 g capsule Take 2 capsules by mouth daily. *Metamucil* at bedtime  . Thiamine HCl (VITAMIN B-1) 250 MG tablet Take 250 mg by mouth daily.  . vitamin C (ASCORBIC ACID) 500 MG tablet Take 500 mg by mouth daily.  . Zinc Picolinate 25 MG TABS Take 25 mg by mouth as needed. If in contact with someone with Covid  . Calcium-Magnesium-Zinc 500-250-12.5 MG TABS Take 1 tablet by mouth daily.  (Patient not taking: No sig reported)  . desoximetasone (TOPICORT) 0.25 % cream Apply 1 application topically 2 (two) times daily. (Patient not taking: No sig reported)   No facility-administered encounter medications on file as of 01/22/2021.    Allergies (verified) Vitamin b12, Adhesive [tape], and Nickel   History: Past Medical History:  Diagnosis Date  . Arthritis 2009  . Breast cancer [facility]) 2011   right breast with mammosite  . Bronchitis   . Cancer [facility]) 2011   right breast, L/SN/R stage 1 cancer ER/PR pos HER 2 negative  . Colitis 1980  . Malignant neoplasm of lower-outer quadrant of female breast ([place]) 2011  . Special screening for malignant neoplasms, colon   . Ulcer 1969   stomach  . Wrist fracture    age of 66   Past Surgical History:  Procedure Laterality  Date  . BREAST BIOPSY Right 2011   invasive mammary carcinoma  . BREAST BIOPSY Right 07/09/2014   fat necrosis  . BREAST EXCISIONAL BIOPSY Right    age of 78 - neg  . BREAST LUMPECTOMY Right 2011   right breast invasive mammary carcinoma  . BREAST LUMPECTOMY WITH SENTINEL LYMPH NODE BIOPSY Right 2011  . BREAST MAMMOSITE Right 2011  . BREAST SURGERY Right 2011   stereo  . COLONOSCOPY  2009  . FRACTURE SURGERY Left 2009  . TONSILLECTOMY     age of  77 yrs  . TUBAL LIGATION  1986   Family History  Problem Relation Age of Onset  . Breast cancer Paternal Aunt 7  . Breast cancer Cousin   . Breast cancer Cousin   . Diabetes Mother   . Obesity Son    Social History   Socioeconomic History  . Marital status: Married    Spouse name: Kyung Rudd  . Number of children: 2  . Years of education: 76  . Highest education level: Some college, no degree  Occupational History  . Occupation: retired  Tobacco Use  . Smoking status: Former Smoker    Packs/day: 1.00    Years: 20.00    Pack years: 20.00    Types: Cigarettes    Quit date: 06/12/2001    Years since quitting: 19.6  . Smokeless tobacco: Never Used  . Tobacco comment: quit in 2002  Vaping Use  . Vaping Use: Never used  Substance and Sexual Activity  . Alcohol use: Yes    Comment: wine- once per month  . Drug use: No  . Sexual activity: Not on file  Other Topics Concern  . Not on file  Social History Narrative  . Not on file   Social Determinants of Health   Financial Resource Strain: Low Risk   . Difficulty of Paying Living Expenses: Not hard at all  Food Insecurity: No Food Insecurity  . Worried About Charity fundraiser in the Last Year: Never true  . Ran Out of Food in the Last Year: Never true  Transportation Needs: No Transportation Needs  . Lack of Transportation (Medical): No  . Lack of Transportation (Non-Medical): No  Physical Activity: Insufficiently Active  . Days of Exercise per Week: 5 days  . Minutes of Exercise per Session: 20 min  Stress: No Stress Concern Present  . Feeling of Stress : Not at all  Social Connections: Socially Isolated  . Frequency of Communication with Friends and Family: Never  . Frequency of Social Gatherings with Friends and Family: Never  . Attends Religious Services: Never  . Active Member of Clubs or Organizations: No  . Attends Archivist Meetings: Never  . Marital Status: Married    Tobacco  Counseling Counseling given: Not Answered Comment: quit in 2002   Clinical Intake:  Pre-visit preparation completed: Yes  Pain : No/denies pain     Nutritional Risks: None Diabetes: No  How often do you need to have someone help you when you read instructions, pamphlets, or other written materials from your doctor or pharmacy?: 1 - Never  Diabetic? No  Interpreter Needed?: No  Information entered by :: [facility], LPN   Activities of Daily Living In your present state of health, do you have any difficulty performing the following activities: 01/22/2021  Hearing? N  Vision? N  Difficulty concentrating or making decisions? N  Walking or climbing stairs? N  Dressing or bathing? N  Doing errands,  shopping? N  Preparing Food and eating ? N  Using the Toilet? N  In the past six months, have you accidently leaked urine? Y  Comment Daily- does not wear protection.  Do you have problems with loss of bowel control? N  Managing your Medications? N  Managing your Finances? N  Housekeeping or managing your Housekeeping? N  Some recent data might be hidden    Patient Care Team: Virginia Crews, MD as PCP - General (Family Medicine) [place], [place] (Optometry)  Indicate any recent Medical Services you may have received from other than [facility] providers in the past year (date may be approximate).     Assessment:   This is a routine wellness examination for [facility].  Hearing/Vision screen No exam data present  Dietary issues and exercise activities discussed: Current Exercise Habits: Home exercise routine, Type of exercise: yoga, Time (Minutes): 20, Frequency (Times/Week): 5, Weekly Exercise (Minutes/Week): 100, Intensity: Mild, Exercise limited by: None identified  Goals    . Exercise 3x per week (30 min per time)     Recommend to exercise for 3 days a week for at least 30 minutes at a time.     . Increase water intake     Recommend increasing water intake to  6-8 8 oz glasses a day.       Depression Screen PHQ 2/9 Scores 01/22/2021 01/18/2020 10/10/2018 10/07/2017 08/26/2017  PHQ - 2 Score 0 1 6 2 3   PHQ- 9 Score - - 15 7 4     Fall Risk Fall Risk  01/22/2021 01/18/2020 10/10/2018 10/07/2017 08/26/2017  Falls in the past year? 0 0 0 No No  Number falls in past yr: 0 0 - - -  Injury with Fall? 0 0 - - -    FALL RISK PREVENTION PERTAINING TO THE HOME:  Any stairs in or around the home? Yes  If so, are there any without handrails? No  Home free of loose throw rugs in walkways, pet beds, electrical cords, etc? Yes  Adequate lighting in your home to reduce risk of falls? Yes   ASSISTIVE DEVICES UTILIZED TO PREVENT FALLS:  Life alert? No  Use of a cane, walker or w/c? No  Grab bars in the bathroom? No  Shower chair or bench in shower? Yes  Elevated toilet seat or a handicapped toilet? No    Cognitive Function: Normal cognitive status assessed by observation by this Nurse Health Advisor. No abnormalities found.       6CIT Screen 08/26/2017  What Year? 0 points  What month? 0 points  What time? 0 points  Count back from 20 0 points  Months in reverse 0 points  Repeat phrase 0 points  Total Score 0    Immunizations Immunization History  Administered Date(s) Administered  . Influenza, High Dose Seasonal PF 08/26/2017, 10/10/2018  . PFIZER(Purple Top)SARS-COV-2 Vaccination 01/19/2020, 02/14/2020  . Pneumococcal Conjugate-13 08/26/2017  . Pneumococcal Polysaccharide-23 10/10/2018  . Tdap 10/05/2011  . Zoster Recombinat (Shingrix) 07/15/2018, 05/23/2019    TDAP status: Up to date  Flu Vaccine status: Declined, Education has been provided regarding the importance of this vaccine but patient still declined. Advised may receive this vaccine at local pharmacy or Health Dept. Aware to provide a copy of the vaccination record if obtained from local pharmacy or Health Dept. Verbalized acceptance and understanding.  Pneumococcal vaccine  status: Up to date  Covid-19 vaccine status: Completed vaccines  Qualifies for Shingles Vaccine? Yes   Zostavax completed  No   Shingrix Completed?: Yes  Screening Tests Health Maintenance  Topic Date Due  . COVID-19 Vaccine (3 - Pfizer risk 4-dose series) 03/13/2020  . MAMMOGRAM  07/06/2020  . DEXA SCAN  10/24/2020  . INFLUENZA VACCINE  02/20/2021 (Originally 06/23/2020)  . TETANUS/TDAP  10/04/2021  . COLONOSCOPY (Pts 45-13yrs Insurance coverage will need to be confirmed)  01/22/2025  . Hepatitis C Screening  Completed  . PNA vac Low Risk Adult  Completed  . HPV VACCINES  Aged Out    Health Maintenance  Health Maintenance Due  Topic Date Due  . COVID-19 Vaccine (3 - Pfizer risk 4-dose series) 03/13/2020  . MAMMOGRAM  07/06/2020  . DEXA SCAN  10/24/2020    Colorectal cancer screening: Type of screening: Colonoscopy. Completed 01/23/15. Repeat every 10 years  Mammogram status: Ordered 03/28/20. Pt provided with contact info and advised to call to schedule appt.  Pt declined scheduling at this time.  Bone Density status: Currently due, declined order today.   Lung Cancer Screening: (Low Dose CT Chest recommended if Age 55-80 years, 30 pack-year currently smoking OR have quit w/in 15years.) does not qualify.   Additional Screening:  Hepatitis C Screening: Up to date  Vision Screening: Recommended annual ophthalmology exams for early detection of glaucoma and other disorders of the eye. Is the patient up to date with their annual eye exam?  Yes  Who is the provider or what is the name of the office in which the patient attends annual eye exams? [place] If pt is not established with a provider, would they like to be referred to a provider to establish care? No .   Dental Screening: Recommended annual dental exams for proper oral hygiene  Community Resource Referral / Chronic Care Management: CRR required this visit?  No   CCM required this visit?  No      Plan:     I have  personally reviewed and noted the following in the patient's chart:   . Medical and social history . Use of alcohol, tobacco or illicit drugs  . Current medications and supplements . Functional ability and status . Nutritional status . Physical activity . Advanced directives . List of other physicians . Hospitalizations, surgeries, and ER visits in previous 12 months . Vitals . Screenings to include cognitive, depression, and falls . Referrals and appointments  In addition, I have reviewed and discussed with patient certain preventive protocols, quality metrics, and best practice recommendations. A written personalized care plan for preventive services as well as general preventive health recommendations were provided to patient.     Enez Monahan [place], [place]   7/0/2637   Nurse Notes: Pt declined receiving a flu or future Covid booster. Pt also declined completing a mammogram or DEXA scan.

## 2021-01-22 ENCOUNTER — Other Ambulatory Visit: Payer: Self-pay

## 2021-01-22 ENCOUNTER — Ambulatory Visit (INDEPENDENT_AMBULATORY_CARE_PROVIDER_SITE_OTHER): Payer: Medicare HMO

## 2021-01-22 DIAGNOSIS — Z0000 Encounter for general adult medical examination without abnormal findings: Secondary | ICD-10-CM

## 2021-01-22 NOTE — Patient Instructions (Signed)
Lacey Jordan , Thank you for taking time to come for your Medicare Wellness Visit. I appreciate your ongoing commitment to your health goals. Please review the following plan we discussed and let me know if I can assist you in the future.   Screening recommendations/referrals: Colonoscopy: Up to date, due 01/2025 Mammogram: Currently due, order on file- declined completing at this time. Bone Density: Currently due, declined order today.  Recommended yearly ophthalmology/optometry visit for glaucoma screening and checkup Recommended yearly dental visit for hygiene and checkup  Vaccinations: Influenza vaccine: Currently due, declined receiving. Pneumococcal vaccine: Completed series Tdap vaccine: Up to date, due 09/2021 Shingles vaccine: Completed series    Advanced directives: Advance directive discussed with you today. Even though you declined this today please call our office should you change your mind and we can give you the proper paperwork for you to fill out.  Conditions/risks identified: Recommend increasing water intake to 6-8 8 oz glasses a day.   Next appointment: 04/01/21 @ 9:00 AM with Dr Brita Romp    Preventive Care 8 Years and Older, Female Preventive care refers to lifestyle choices and visits with your health care provider that can promote health and wellness. What does preventive care include?  A yearly physical exam. This is also called an annual well check.  Dental exams once or twice a year.  Routine eye exams. Ask your health care provider how often you should have your eyes checked.  Personal lifestyle choices, including:  Daily care of your teeth and gums.  Regular physical activity.  Eating a healthy diet.  Avoiding tobacco and drug use.  Limiting alcohol use.  Practicing safe sex.  Taking low-dose aspirin every day.  Taking vitamin and mineral supplements as recommended by your health care provider. What happens during an annual well check? The  services and screenings done by your health care provider during your annual well check will depend on your age, overall health, lifestyle risk factors, and family history of disease. Counseling  Your health care provider may ask you questions about your:  Alcohol use.  Tobacco use.  Drug use.  Emotional well-being.  Home and relationship well-being.  Sexual activity.  Eating habits.  History of falls.  Memory and ability to understand (cognition).  Work and work Statistician.  Reproductive health. Screening  You may have the following tests or measurements:  Height, weight, and BMI.  Blood pressure.  Lipid and cholesterol levels. These may be checked every 5 years, or more frequently if you are over 59 years old.  Skin check.  Lung cancer screening. You may have this screening every year starting at age 102 if you have a 30-pack-year history of smoking and currently smoke or have quit within the past 15 years.  Fecal occult blood test (FOBT) of the stool. You may have this test every year starting at age 47.  Flexible sigmoidoscopy or colonoscopy. You may have a sigmoidoscopy every 5 years or a colonoscopy every 10 years starting at age 39.  Hepatitis C blood test.  Hepatitis B blood test.  Sexually transmitted disease (STD) testing.  Diabetes screening. This is done by checking your blood sugar (glucose) after you have not eaten for a while (fasting). You may have this done every 1-3 years.  Bone density scan. This is done to screen for osteoporosis. You may have this done starting at age 9.  Mammogram. This may be done every 1-2 years. Talk to your health care provider about how often you should  have regular mammograms. Talk with your health care provider about your test results, treatment options, and if necessary, the need for more tests. Vaccines  Your health care provider may recommend certain vaccines, such as:  Influenza vaccine. This is recommended  every year.  Tetanus, diphtheria, and acellular pertussis (Tdap, Td) vaccine. You may need a Td booster every 10 years.  Zoster vaccine. You may need this after age 68.  Pneumococcal 13-valent conjugate (PCV13) vaccine. One dose is recommended after age 43.  Pneumococcal polysaccharide (PPSV23) vaccine. One dose is recommended after age 41. Talk to your health care provider about which screenings and vaccines you need and how often you need them. This information is not intended to replace advice given to you by your health care provider. Make sure you discuss any questions you have with your health care provider. Document Released: 12/06/2015 Document Revised: 07/29/2016 Document Reviewed: 09/10/2015 Elsevier Interactive Patient Education  2017 [place] Prevention in the Home Falls can cause injuries. They can happen to people of all ages. There are many things you can do to make your home safe and to help prevent falls. What can I do on the outside of my home?  Regularly fix the edges of walkways and driveways and fix any cracks.  Remove anything that might make you trip as you walk through a door, such as a raised step or threshold.  Trim any bushes or trees on the path to your home.  Use bright outdoor lighting.  Clear any walking paths of anything that might make someone trip, such as rocks or tools.  Regularly check to see if handrails are loose or broken. Make sure that both sides of any steps have handrails.  Any raised decks and porches should have guardrails on the edges.  Have any leaves, snow, or ice cleared regularly.  Use sand or salt on walking paths during winter.  Clean up any spills in your garage right away. This includes oil or grease spills. What can I do in the bathroom?  Use night lights.  Install grab bars by the toilet and in the tub and shower. Do not use towel bars as grab bars.  Use non-skid mats or decals in the tub or shower.  If  you need to sit down in the shower, use a plastic, non-slip stool.  Keep the floor dry. Clean up any water that spills on the floor as soon as it happens.  Remove soap buildup in the tub or shower regularly.  Attach bath mats securely with double-sided non-slip rug tape.  Do not have throw rugs and other things on the floor that can make you trip. What can I do in the bedroom?  Use night lights.  Make sure that you have a light by your bed that is easy to reach.  Do not use any sheets or blankets that are too big for your bed. They should not hang down onto the floor.  Have a firm chair that has side arms. You can use this for support while you get dressed.  Do not have throw rugs and other things on the floor that can make you trip. What can I do in the kitchen?  Clean up any spills right away.  Avoid walking on wet floors.  Keep items that you use a lot in easy-to-reach places.  If you need to reach something above you, use a strong step stool that has a grab bar.  Keep electrical cords out of  the way.  Do not use floor polish or wax that makes floors slippery. If you must use wax, use non-skid floor wax.  Do not have throw rugs and other things on the floor that can make you trip. What can I do with my stairs?  Do not leave any items on the stairs.  Make sure that there are handrails on both sides of the stairs and use them. Fix handrails that are broken or loose. Make sure that handrails are as long as the stairways.  Check any carpeting to make sure that it is firmly attached to the stairs. Fix any carpet that is loose or worn.  Avoid having throw rugs at the top or bottom of the stairs. If you do have throw rugs, attach them to the floor with carpet tape.  Make sure that you have a light switch at the top of the stairs and the bottom of the stairs. If you do not have them, ask someone to add them for you. What else can I do to help prevent falls?  Wear shoes  that:  Do not have high heels.  Have rubber bottoms.  Are comfortable and fit you well.  Are closed at the toe. Do not wear sandals.  If you use a stepladder:  Make sure that it is fully opened. Do not climb a closed stepladder.  Make sure that both sides of the stepladder are locked into place.  Ask someone to hold it for you, if possible.  Clearly mark and make sure that you can see:  Any grab bars or handrails.  First and last steps.  Where the edge of each step is.  Use tools that help you move around (mobility aids) if they are needed. These include:  Canes.  Walkers.  Scooters.  Crutches.  Turn on the lights when you go into a dark area. Replace any light bulbs as soon as they burn out.  Set up your furniture so you have a clear path. Avoid moving your furniture around.  If any of your floors are uneven, fix them.  If there are any pets around you, be aware of where they are.  Review your medicines with your doctor. Some medicines can make you feel dizzy. This can increase your chance of falling. Ask your doctor what other things that you can do to help prevent falls. This information is not intended to replace advice given to you by your health care provider. Make sure you discuss any questions you have with your health care provider. Document Released: 09/05/2009 Document Revised: 04/16/2016 Document Reviewed: 12/14/2014 Elsevier Interactive Patient Education  2017 [employer].

## 2021-03-06 ENCOUNTER — Encounter: Payer: Self-pay | Admitting: Adult Health

## 2021-03-15 IMAGING — CR DG ABDOMEN 1V
1 series · 2 of 2 positions shown · non-contrast
Comparison: None.

CLINICAL DATA: 69-year-old female with constipation.

EXAM:
ABDOMEN - 1 VIEW

[Series 1: dg abd 1 view · 0.14mm/px · 2 of 2 slices shown]
[im 1/2]
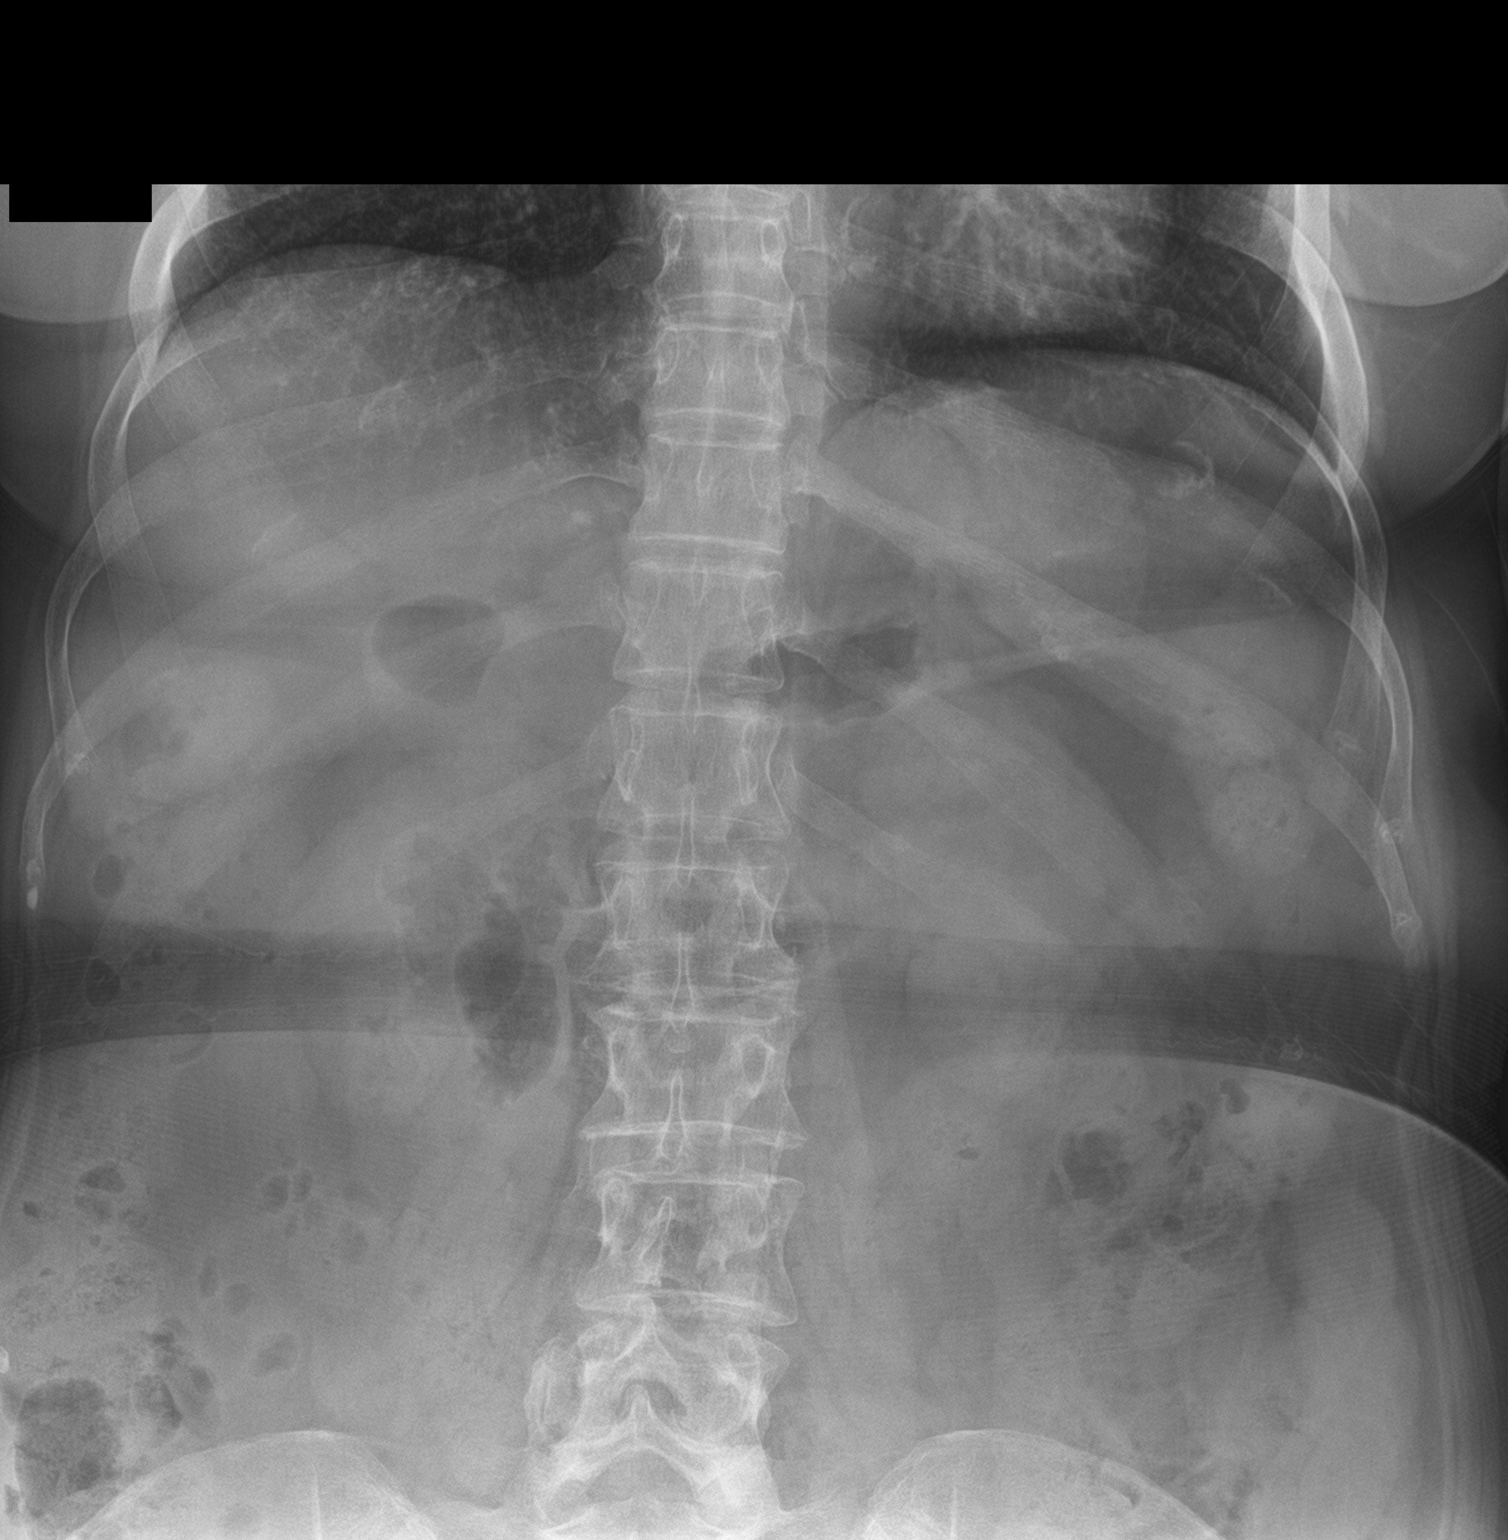
[im 2/2]
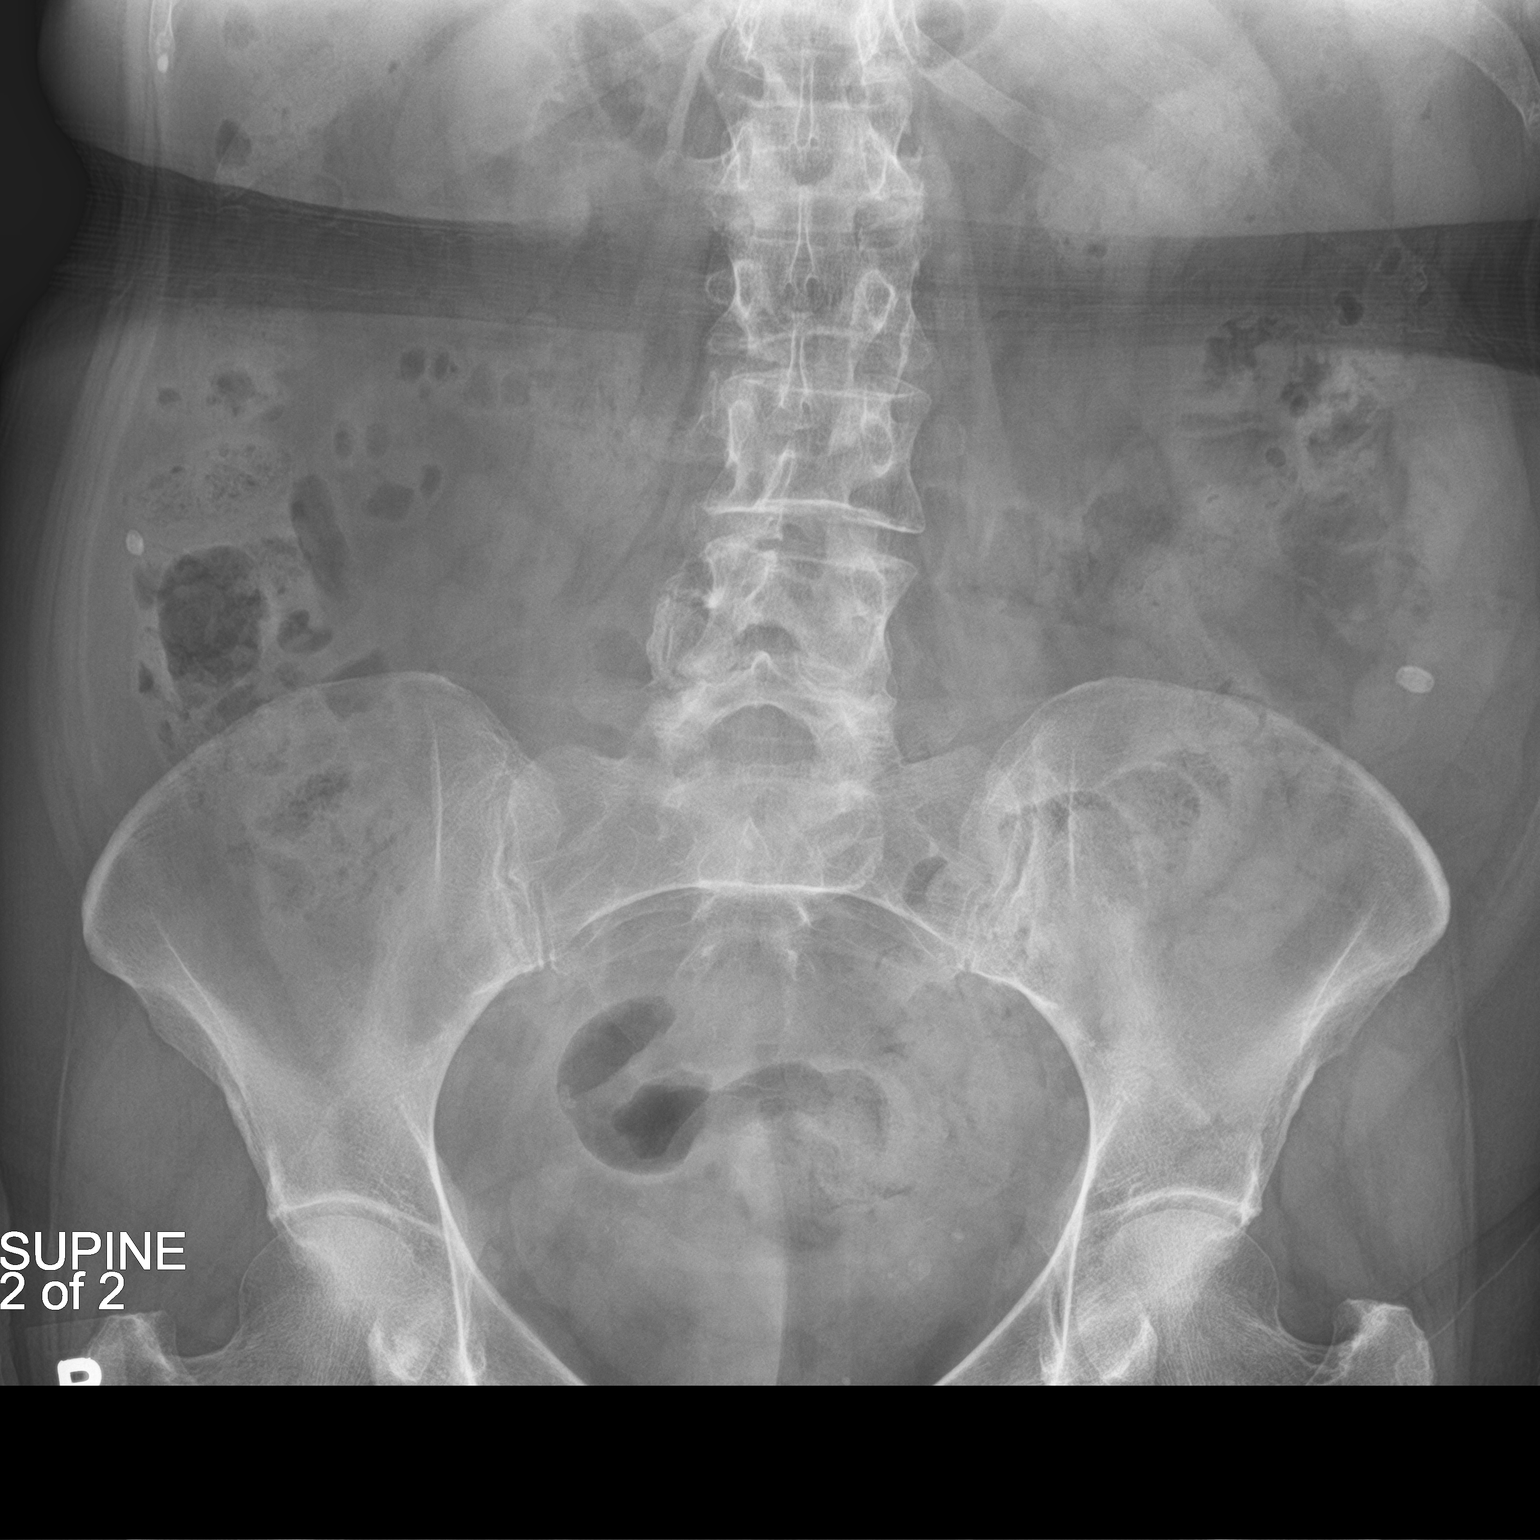

[2 of 2 positions shown; findings below may reference images not displayed]

FINDINGS: The bowel gas pattern is normal.

A small amount of stool within the colon is noted.

No radio-opaque calculi or other significant radiographic
abnormality are seen.
IMPRESSION: Negative.

## 2021-04-01 ENCOUNTER — Other Ambulatory Visit: Payer: Self-pay

## 2021-04-01 ENCOUNTER — Encounter: Payer: Self-pay | Admitting: Family Medicine

## 2021-04-01 ENCOUNTER — Ambulatory Visit (INDEPENDENT_AMBULATORY_CARE_PROVIDER_SITE_OTHER): Payer: Medicare HMO | Admitting: Family Medicine

## 2021-04-01 VITALS — BP 144/86 | HR 74 | Temp 98.1°F | Ht 65.5 in | Wt 153.0 lb

## 2021-04-01 DIAGNOSIS — Z0000 Encounter for general adult medical examination without abnormal findings: Secondary | ICD-10-CM | POA: Diagnosis not present

## 2021-04-01 DIAGNOSIS — M8589 Other specified disorders of bone density and structure, multiple sites: Secondary | ICD-10-CM

## 2021-04-01 DIAGNOSIS — Z780 Asymptomatic menopausal state: Secondary | ICD-10-CM | POA: Diagnosis not present

## 2021-04-01 DIAGNOSIS — E785 Hyperlipidemia, unspecified: Secondary | ICD-10-CM

## 2021-04-01 NOTE — Progress Notes (Signed)
Complete physical exam   Patient: Lacey Jordan   DOB: 28-Aug-1951   70 y.o. Female  MRN: [id number] Visit Date: 04/01/2021  Today's healthcare provider: Lavon Paganini, MD   Chief Complaint  Patient presents with  . Annual Exam   Subjective    Lacey Jordan is a 70 y.o. female who presents today for a complete physical exam.  She reports consuming a general diet. . She generally feels well. She reports sleeping fairly well. She does not have additional problems to discuss today.  HPI   Lacey Jordan reports she is feeling well.   Allergies She says that due to allergy season she typically gets headaches and feels a little congested. She denies checking her blood pressure at home. Although, she feels her blood pressure is elevated due to allergies.    Mammogram She has a scare that bothers her and she denies going to receive another mammogram.    Vaccines She denies receiving her COVID booster shot.   Medicines She is compliant with taking her vitamin D. She interrupted taking vitamin C supplements. She questions if she needs to resume consumption of Vitamin C supplements to maintain her bone density.    07/06/18 Mammogram-BI-RADS 1 10/24/18 BMD-Osteoporosis 01/23/15 Colonoscopy-polyps,diveticulosis,   Past Medical History:  Diagnosis Date  . Arthritis 2009  . Breast cancer [facility]) 2011   right breast with mammosite  . Bronchitis   . Cancer [facility]) 2011   right breast, L/SN/R stage 1 cancer ER/PR pos HER 2 negative  . Colitis 1980  . Malignant neoplasm of lower-outer quadrant of female breast ([place]) 2011  . Special screening for malignant neoplasms, colon   . Ulcer 1969   stomach  . Wrist fracture    age of 81   Past Surgical History:  Procedure Laterality Date  . BREAST BIOPSY Right 2011   invasive mammary carcinoma  . BREAST BIOPSY Right 07/09/2014   fat necrosis  . BREAST EXCISIONAL BIOPSY Right    age of 64 - neg  . BREAST LUMPECTOMY Right 2011    right breast invasive mammary carcinoma  . BREAST LUMPECTOMY WITH SENTINEL LYMPH NODE BIOPSY Right 2011  . BREAST MAMMOSITE Right 2011  . BREAST SURGERY Right 2011   stereo  . COLONOSCOPY  2009  . FRACTURE SURGERY Left 2009  . TONSILLECTOMY     age of 42 yrs  . TUBAL LIGATION  1986   Social History   Socioeconomic History  . Marital status: Married    Spouse name: Kyung Rudd  . Number of children: 2  . Years of education: 41  . Highest education level: Some college, no degree  Occupational History  . Occupation: retired  Tobacco Use  . Smoking status: Former Smoker    Packs/day: 1.00    Years: 20.00    Pack years: 20.00    Types: Cigarettes    Quit date: 06/12/2001    Years since quitting: 19.8  . Smokeless tobacco: Never Used  . Tobacco comment: quit in 2002  Vaping Use  . Vaping Use: Never used  Substance and Sexual Activity  . Alcohol use: Yes    Comment: wine- once per month  . Drug use: No  . Sexual activity: Not on file  Other Topics Concern  . Not on file  Social History Narrative  . Not on file   Social Determinants of Health   Financial Resource Strain: Low Risk   . Difficulty of Paying Living Expenses: Not hard at all  Food Insecurity: No Food Insecurity  . Worried About Charity fundraiser in the Last Year: Never true  . Ran Out of Food in the Last Year: Never true  Transportation Needs: No Transportation Needs  . Lack of Transportation (Medical): No  . Lack of Transportation (Non-Medical): No  Physical Activity: Insufficiently Active  . Days of Exercise per Week: 5 days  . Minutes of Exercise per Session: 20 min  Stress: No Stress Concern Present  . Feeling of Stress : Not at all  Social Connections: Socially Isolated  . Frequency of Communication with Friends and Family: Never  . Frequency of Social Gatherings with Friends and Family: Never  . Attends Religious Services: Never  . Active Member of Clubs or Organizations: No  . Attends Theatre manager Meetings: Never  . Marital Status: Married  Human resources officer Violence: Not At Risk  . Fear of Current or Ex-Partner: No  . Emotionally Abused: No  . Physically Abused: No  . Sexually Abused: No   Family Status  Relation Name Status  . Ethlyn Daniels  (Not Specified)  . Cousin  (Not Specified)  . Cousin  (Not Specified)  . Mother  Deceased  . Father  Deceased  . Daughter  Alive  . Son  Deceased       comitted sucide, had substance abuse   Family History  Problem Relation Age of Onset  . Breast cancer Paternal Aunt 10  . Breast cancer Cousin   . Breast cancer Cousin   . Diabetes Mother   . Obesity Son    Allergies  Allergen Reactions  . Vitamin B12 Itching and Other (See Comments)  . Adhesive [Tape] Rash    Band aid sensitivity  . Nickel Rash    Patient Care Team: Virginia Crews, MD as PCP - General (Family Medicine) [facility], [facility])   Medications: Outpatient Medications Prior to Visit  Medication Sig  . Cholecalciferol (VITAMIN D3) 125 MCG (5000 UT) TABS Take 5,000 Units by mouth daily.  Marland Kitchen desoximetasone (TOPICORT) 0.25 % cream Apply 1 application topically 2 (two) times daily.  Marland Kitchen ibuprofen (ADVIL,MOTRIN) 200 MG tablet Take 200 mg by mouth every 6 (six) hours as needed.  . Melatonin 3 MG TABS Take 3 mg by mouth at bedtime as needed.  . Menaquinone-7 (VITAMIN K2) 100 MCG CAPS Take 100 mcg by mouth daily at 6 (six) AM.  . Multiple Vitamin (MULTIVITAMIN) tablet Take 1 tablet by mouth daily.  . Omega 3 1200 MG CAPS Take 1,200 mg by mouth daily.  . psyllium (REGULOID) 0.52 g capsule Take 2 capsules by mouth daily. *Metamucil* at bedtime  . Thiamine HCl (VITAMIN B-1) 250 MG tablet Take 250 mg by mouth daily.  . vitamin C (ASCORBIC ACID) 500 MG tablet Take 500 mg by mouth daily.  . [DISCONTINUED] Calcium-Magnesium-Zinc 500-250-12.5 MG TABS Take 1 tablet by mouth daily.  (Patient not taking: No sig reported)  . [DISCONTINUED] Zinc Picolinate 25  MG TABS Take 25 mg by mouth as needed. If in contact with someone with Covid   No facility-administered medications prior to visit.    Review of Systems  Constitutional: Negative for chills, fatigue and fever.  HENT: Negative for ear pain, nosebleeds, sinus pressure, sinus pain and sore throat.   Eyes: Negative for pain.  Respiratory: Negative for cough, chest tightness, shortness of breath and wheezing.   Cardiovascular: Negative for chest pain, palpitations and leg swelling.  Gastrointestinal: Negative for abdominal pain,  blood in stool, constipation, diarrhea, nausea and vomiting.  Genitourinary: Negative for dysuria, flank pain, frequency, pelvic pain, urgency and vaginal pain.  Musculoskeletal: Negative for back pain, neck pain and neck stiffness.  Allergic/Immunologic: Positive for environmental allergies.  Neurological: Positive for headaches. Negative for dizziness, syncope, weakness, light-headedness and numbness.      Objective    BP (!) 144/86 (BP Location: Right Arm, Patient Position: Sitting, Cuff Size: Normal)   Pulse 74   Temp 98.1 F (36.7 C) (Oral)   Ht 5' 5.5" (1.664 m)   Wt 153 lb (69.4 kg)   BMI 25.07 kg/m    Physical Exam Vitals reviewed.  Constitutional:      General: She is not in acute distress.    Appearance: Normal appearance. She is well-developed. She is not diaphoretic.  HENT:     Head: Normocephalic and atraumatic.     Right Ear: Tympanic membrane, ear canal and external ear normal.     Left Ear: Tympanic membrane, ear canal and external ear normal.     Nose: Nose normal.     Mouth/Throat:     Mouth: Mucous membranes are moist.     Pharynx: Oropharynx is clear. No oropharyngeal exudate.  Eyes:     General: No scleral icterus.    Conjunctiva/sclera: Conjunctivae normal.     Pupils: Pupils are equal, round, and reactive to light.  Neck:     Thyroid: No thyromegaly.  Cardiovascular:     Rate and Rhythm: Normal rate and regular rhythm.      Pulses: Normal pulses.     Heart sounds: Normal heart sounds. No murmur heard.   Pulmonary:     Effort: Pulmonary effort is normal. No respiratory distress.     Breath sounds: Normal breath sounds. No wheezing or rales.  Abdominal:     General: There is no distension.     Palpations: Abdomen is soft.     Tenderness: There is no abdominal tenderness.  Musculoskeletal:        General: No deformity.     Cervical back: Neck supple.     Right lower leg: No edema.     Left lower leg: No edema.  Lymphadenopathy:     Cervical: No cervical adenopathy.  Skin:    General: Skin is warm and dry.     Findings: No rash.  Neurological:     Mental Status: She is alert and oriented to person, place, and time. Mental status is at baseline.     Sensory: No sensory deficit.     Motor: No weakness.     Gait: Gait normal.  Psychiatric:        Mood and Affect: Mood normal.        Behavior: Behavior normal.        Thought Content: Thought content normal.     Last depression screening scores PHQ 2/9 Scores 04/01/2021 01/22/2021 01/18/2020  PHQ - 2 Score 0 0 1  PHQ- 9 Score 0 - -   Last fall risk screening Fall Risk  04/01/2021  Falls in the past year? 0  Number falls in past yr: 0  Injury with Fall? 0  Risk for fall due to : No Fall Risks  Follow up Falls evaluation completed   Last Audit-C alcohol use screening Alcohol Use Disorder Test (AUDIT) 04/01/2021  1. How often do you have a drink containing alcohol? 1  2. How many drinks containing alcohol do you have on a typical day when you  are drinking? 0  3. How often do you have six or more drinks on one occasion? 0  AUDIT-C Score 1   A score of 3 or more in women, and 4 or more in men indicates increased risk for alcohol abuse, EXCEPT if all of the points are from question 1   No results found for any visits on 04/01/21.  Assessment & Plan     Problem List Items Addressed This Visit      Musculoskeletal and Integument   Osteopenia    Relevant Orders   DG Bone Density     Other   Hyperlipidemia   Relevant Orders   Comprehensive metabolic panel   Lipid panel    Other Visit Diagnoses    Encounter for annual physical exam    -  Primary   Relevant Orders   Comprehensive metabolic panel   Lipid panel   Postmenopausal       Relevant Orders   DG Bone Density       Routine Health Maintenance and Physical Exam  Exercise Activities and Dietary recommendations Goals    . Exercise 3x per week (30 min per time)     Recommend to exercise for 3 days a week for at least 30 minutes at a time.     . Increase water intake     Recommend increasing water intake to 6-8 8 oz glasses a day.        Immunization History  Administered Date(s) Administered  . Influenza, High Dose Seasonal PF 08/26/2017, 10/10/2018  . PFIZER(Purple Top)SARS-COV-2 Vaccination 01/19/2020, 02/14/2020  . Pneumococcal Conjugate-13 08/26/2017  . Pneumococcal Polysaccharide-23 10/10/2018  . Tdap 10/05/2011  . Zoster Recombinat (Shingrix) 07/15/2018, 05/23/2019    Health Maintenance  Topic Date Due  . COVID-19 Vaccine (3 - Pfizer risk 4-dose series) 03/13/2020  . MAMMOGRAM  07/06/2020  . DEXA SCAN  10/24/2020  . INFLUENZA VACCINE  06/23/2021  . TETANUS/TDAP  10/04/2021  . COLONOSCOPY (Pts 45-29yrs Insurance coverage will need to be confirmed)  01/22/2025  . Hepatitis C Screening  Completed  . PNA vac Low Risk Adult  Completed  . HPV VACCINES  Aged Out    Discussed health benefits of physical activity, and encouraged her to engage in regular exercise appropriate for her age and condition.    Return in about 1 year (around 04/01/2022) for CPE, AWV.      I,Essence Turner,acting as a Education administrator for Lavon Paganini, MD.,have documented all relevant documentation on the behalf of Lavon Paganini, MD,as directed by  Lavon Paganini, MD while in the presence of Lavon Paganini, MD.  I, Lavon Paganini, MD, have reviewed all  documentation for this visit. The documentation on 04/01/21 for the exam, diagnosis, procedures, and orders are all accurate and complete.   Rubina Basinski, Dionne Bucy, MD, MPH [facility]

## 2021-04-01 NOTE — Patient Instructions (Signed)
Preventive Care 61 Years and Older, Female Preventive care refers to lifestyle choices and visits with your health care provider that can promote health and wellness. This includes:  A yearly physical exam. This is also called an annual wellness visit.  Regular dental and eye exams.  Immunizations.  Screening for certain conditions.  Healthy lifestyle choices, such as: ? Eating a healthy diet. ? Getting regular exercise. ? Not using drugs or products that contain nicotine and tobacco. ? Limiting alcohol use. What can I expect for my preventive care visit? Physical exam Your health care provider will check your:  Height and weight. These may be used to calculate your BMI (body mass index). BMI is a measurement that tells if you are at a healthy weight.  Heart rate and blood pressure.  Body temperature.  Skin for abnormal spots. Counseling Your health care provider may ask you questions about your:  Past medical problems.  Family's medical history.  Alcohol, tobacco, and drug use.  Emotional well-being.  Home life and relationship well-being.  Sexual activity.  Diet, exercise, and sleep habits.  History of falls.  Memory and ability to understand (cognition).  Work and work Statistician.  Pregnancy and menstrual history.  Access to firearms. What immunizations do I need? Vaccines are usually given at various ages, according to a schedule. Your health care provider will recommend vaccines for you based on your age, medical history, and lifestyle or other factors, such as travel or where you work.   What tests do I need? Blood tests  Lipid and cholesterol levels. These may be checked every 5 years, or more often depending on your overall health.  Hepatitis C test.  Hepatitis B test. Screening  Lung cancer screening. You may have this screening every year starting at age 74 if you have a 30-pack-year history of smoking and currently smoke or have quit within  the past 15 years.  Colorectal cancer screening. ? All adults should have this screening starting at age 44 and continuing until age 58. ? Your health care provider may recommend screening at age 2 if you are at increased risk. ? You will have tests every 1-10 years, depending on your results and the type of screening test.  Diabetes screening. ? This is done by checking your blood sugar (glucose) after you have not eaten for a while (fasting). ? You may have this done every 1-3 years.  Mammogram. ? This may be done every 1-2 years. ? Talk with your health care provider about how often you should have regular mammograms.  Abdominal aortic aneurysm (AAA) screening. You may need this if you are a current or former smoker.  BRCA-related cancer screening. This may be done if you have a family history of breast, ovarian, tubal, or peritoneal cancers. Other tests  STD (sexually transmitted disease) testing, if you are at risk.  Bone density scan. This is done to screen for osteoporosis. You may have this done starting at age 104. Talk with your health care provider about your test results, treatment options, and if necessary, the need for more tests. Follow these instructions at home: Eating and drinking  Eat a diet that includes fresh fruits and vegetables, whole grains, lean protein, and low-fat dairy products. Limit your intake of foods with high amounts of sugar, saturated fats, and salt.  Take vitamin and mineral supplements as recommended by your health care provider.  Do not drink alcohol if your health care provider tells you not to drink.  If you drink alcohol: ? Limit how much you have to 0-1 drink a day. ? Be aware of how much alcohol is in your drink. In the [place] one drink equals one 12 oz bottle of beer (355 mL), one 5 oz glass of wine (148 mL), or one 1 oz glass of hard liquor (44 mL).   Lifestyle  Take daily care of your teeth and gums. Brush your teeth every morning  and night with fluoride toothpaste. Floss one time each day.  Stay active. Exercise for at least 30 minutes 5 or more days each week.  Do not use any products that contain nicotine or tobacco, such as cigarettes, e-cigarettes, and chewing tobacco. If you need help quitting, ask your health care provider.  Do not use drugs.  If you are sexually active, practice safe sex. Use a condom or other form of protection in order to prevent STIs (sexually transmitted infections).  Talk with your health care provider about taking a low-dose aspirin or statin.  Find healthy ways to cope with stress, such as: ? Meditation, yoga, or listening to music. ? Journaling. ? Talking to a trusted person. ? Spending time with friends and family. Safety  Always wear your seat belt while driving or riding in a vehicle.  Do not drive: ? If you have been drinking alcohol. Do not ride with someone who has been drinking. ? When you are tired or distracted. ? While texting.  Wear a helmet and other protective equipment during sports activities.  If you have firearms in your house, make sure you follow all gun safety procedures. What's next?  Visit your health care provider once a year for an annual wellness visit.  Ask your health care provider how often you should have your eyes and teeth checked.  Stay up to date on all vaccines. This information is not intended to replace advice given to you by your health care provider. Make sure you discuss any questions you have with your health care provider. Document Revised: 10/30/2020 Document Reviewed: 11/03/2018 Elsevier Patient Education  2021 Elsevier Inc.  

## 2021-04-02 LAB — COMPREHENSIVE METABOLIC PANEL
ALT: 46 IU/L — ABNORMAL HIGH (ref 0–32)
AST: 29 IU/L (ref 0–40)
Albumin/Globulin Ratio: 1.8 (ref 1.2–2.2)
Albumin: 4.4 g/dL (ref 3.8–4.8)
Alkaline Phosphatase: 90 IU/L (ref 44–121)
BUN/Creatinine Ratio: 14 (ref 12–28)
BUN: 10 mg/dL (ref 8–27)
Bilirubin Total: 0.3 mg/dL (ref 0.0–1.2)
CO2: 24 mmol/L (ref 20–29)
Calcium: 9.9 mg/dL (ref 8.7–10.3)
Chloride: 106 mmol/L (ref 96–106)
Creatinine, Ser: 0.72 mg/dL (ref 0.57–1.00)
Globulin, Total: 2.5 g/dL (ref 1.5–4.5)
Glucose: 93 mg/dL (ref 65–99)
Potassium: 5.1 mmol/L (ref 3.5–5.2)
Sodium: 143 mmol/L (ref 134–144)
Total Protein: 6.9 g/dL (ref 6.0–8.5)
eGFR: 90 mL/min/{1.73_m2} (ref >59)

## 2021-04-02 LAB — LIPID PANEL
Chol/HDL Ratio: 4.8 ratio — ABNORMAL HIGH (ref 0.0–4.4)
Cholesterol, Total: 201 mg/dL — ABNORMAL HIGH (ref 100–199)
HDL: 42 mg/dL (ref >39)
LDL Chol Calc (NIH): 133 mg/dL — ABNORMAL HIGH (ref 0–99)
Triglycerides: 144 mg/dL (ref 0–149)
VLDL Cholesterol Cal: 26 mg/dL (ref 5–40)

## 2021-04-03 ENCOUNTER — Telehealth: Payer: Self-pay

## 2021-04-03 NOTE — Telephone Encounter (Signed)
Patient was advised and has declined starting medication, I asked patient if she was going to work on lifestyle changes such as diet/exercise ? And patients response was " I will figure it out". KW

## 2021-04-03 NOTE — Telephone Encounter (Signed)
-----   Message from Virginia Crews, MD sent at 04/03/2021  8:24 AM EDT ----- Normal labs, except for high cholesterol. The 10-year ASCVD (heart disease and stroke) risk score Mikey Bussing DC Jr., et al., 2013) is: 11.3%, which is high.  Consider statin, which patient and I have discussed in the past and she has declined.  If she agrees, we would start Crestor 5 mg daily #90 with 3 refills.

## 2021-04-23 DIAGNOSIS — Z780 Asymptomatic menopausal state: Secondary | ICD-10-CM | POA: Diagnosis not present

## 2021-04-23 DIAGNOSIS — M8589 Other specified disorders of bone density and structure, multiple sites: Secondary | ICD-10-CM | POA: Diagnosis not present

## 2021-04-23 DIAGNOSIS — N951 Menopausal and female climacteric states: Secondary | ICD-10-CM | POA: Diagnosis not present

## 2021-04-23 LAB — HM DEXA SCAN

## 2021-05-06 ENCOUNTER — Encounter: Payer: Self-pay | Admitting: Family Medicine

## 2021-05-13 ENCOUNTER — Encounter: Payer: Self-pay | Admitting: Family Medicine

## 2021-05-13 NOTE — Telephone Encounter (Signed)
FYI...  Thanks,   -Mickel Baas

## 2021-05-16 ENCOUNTER — Ambulatory Visit: Payer: Medicare HMO | Admitting: Family Medicine

## 2022-04-01 NOTE — Progress Notes (Signed)
? ? ?I,Sulibeya S Dimas,acting as a scribe for Lavon Paganini, MD.,have documented all relevant documentation on the behalf of Lavon Paganini, MD,as directed by  Lavon Paganini, MD while in the presence of Lavon Paganini, MD. ? ? ?Annual Wellness Visit ? ?  ? ?Patient: Lacey Jordan, Female    DOB: February 02, 1951, 71 y.o.   MRN: [id number] ?Visit Date: 04/02/2022 ? ?Today's Provider: Lavon Paganini, MD  ? ?Chief Complaint  ?Patient presents with  ? Medicare Wellness  ? ?Subjective  ?  ?Lacey Jordan is a 71 y.o. female who presents today for her Annual Wellness Visit. ?She reports consuming a general diet. The patient does not participate in regular exercise at present. She generally feels well. She reports sleeping well. She does not have additional problems to discuss today.  ? ?HPI ? ? ? ?Medications: ?Outpatient Medications Prior to Visit  ?Medication Sig  ? Cholecalciferol (VITAMIN D3) 125 MCG (5000 UT) TABS Take 5,000 Units by mouth daily.  ? desoximetasone (TOPICORT) 0.25 % cream Apply 1 application topically 2 (two) times daily.  ? ibuprofen (ADVIL,MOTRIN) 200 MG tablet Take 200 mg by mouth every 6 (six) hours as needed.  ? Melatonin 3 MG TABS Take 3 mg by mouth at bedtime as needed.  ? Menaquinone-7 (VITAMIN K2) 100 MCG CAPS Take 100 mcg by mouth daily at 6 (six) AM.  ? Multiple Vitamin (MULTIVITAMIN) tablet Take 1 tablet by mouth daily.  ? Omega 3 1200 MG CAPS Take 1,200 mg by mouth daily.  ? psyllium (REGULOID) 0.52 g capsule Take 2 capsules by mouth daily. *Metamucil* at bedtime  ? Thiamine HCl (VITAMIN B-1) 250 MG tablet Take 250 mg by mouth daily.  ? vitamin C (ASCORBIC ACID) 500 MG tablet Take 500 mg by mouth daily.  ? ?No facility-administered medications prior to visit.  ?  ?Allergies  ?Allergen Reactions  ? Vitamin B12 Itching and Other (See Comments)  ? Adhesive [Tape] Rash  ?  Band aid sensitivity  ? Nickel Rash  ? ? ?Patient Care Team: ?Virginia Crews, MD as PCP - General  (Family Medicine) ?Pa, [facility]) ? ?Review of Systems  ?Allergic/Immunologic: Positive for environmental allergies.  ?All other systems reviewed and are negative. ? ?Last CBC ?Lab Results  ?Component Value Date  ? WBC 6.2 10/10/2018  ? HGB 14.4 10/10/2018  ? HCT 44.2 10/10/2018  ? MCV 85 10/10/2018  ? MCH 27.6 10/10/2018  ? RDW 14.0 10/10/2018  ? PLT 274 10/10/2018  ? ?Last metabolic panel ?Lab Results  ?Component Value Date  ? GLUCOSE 93 04/01/2021  ? NA 143 04/01/2021  ? K 5.1 04/01/2021  ? CL 106 04/01/2021  ? CO2 24 04/01/2021  ? BUN 10 04/01/2021  ? CREATININE 0.72 04/01/2021  ? EGFR 90 04/01/2021  ? CALCIUM 9.9 04/01/2021  ? PROT 6.9 04/01/2021  ? ALBUMIN 4.4 04/01/2021  ? LABGLOB 2.5 04/01/2021  ? AGRATIO 1.8 04/01/2021  ? BILITOT 0.3 04/01/2021  ? ALKPHOS 90 04/01/2021  ? AST 29 04/01/2021  ? ALT 46 (H) 04/01/2021  ? ?Last lipids ?Lab Results  ?Component Value Date  ? CHOL 201 (H) 04/01/2021  ? HDL 42 04/01/2021  ? LDLCALC 133 (H) 04/01/2021  ? TRIG 144 04/01/2021  ? CHOLHDL 4.8 (H) 04/01/2021  ? ?Last hemoglobin A1c ?No results found for: HGBA1C ?Last thyroid functions ?No results found for: TSH, T3TOTAL, T4TOTAL, THYROIDAB ?  ?  ? Objective  ?  ?Vitals: BP 122/86 (BP Location: Left Arm, Patient  Position: Sitting, Cuff Size: Large)   Pulse 61   Temp 98.7 ?F (37.1 ?C) (Oral)   Resp 16   Ht 5' 5.5" (1.664 m)   Wt 152 lb 9.6 oz (69.2 kg)   SpO2 98%   BMI 25.01 kg/m?  ?BP Readings from Last 3 Encounters:  ?04/02/22 122/86  ?04/01/21 (!) 144/86  ?06/14/20 (!) 134/80  ? ?Wt Readings from Last 3 Encounters:  ?04/02/22 152 lb 9.6 oz (69.2 kg)  ?04/01/21 153 lb (69.4 kg)  ?06/14/20 154 lb 12.8 oz (70.2 kg)  ? ?  ?Physical Exam ?Vitals reviewed.  ?Constitutional:   ?   General: She is not in acute distress. ?   Appearance: Normal appearance. She is well-developed. She is not diaphoretic.  ?HENT:  ?   Head: Normocephalic and atraumatic.  ?   Right Ear: Tympanic membrane, ear canal and  external ear normal.  ?   Left Ear: Tympanic membrane, ear canal and external ear normal.  ?   Nose: Nose normal.  ?   Mouth/Throat:  ?   Mouth: Mucous membranes are moist.  ?   Pharynx: Oropharynx is clear. No oropharyngeal exudate.  ?Eyes:  ?   General: No scleral icterus. ?   Conjunctiva/sclera: Conjunctivae normal.  ?   Pupils: Pupils are equal, round, and reactive to light.  ?Neck:  ?   Thyroid: No thyromegaly.  ?Cardiovascular:  ?   Rate and Rhythm: Normal rate and regular rhythm.  ?   Pulses: Normal pulses.  ?   Heart sounds: Normal heart sounds. No murmur heard. ?Pulmonary:  ?   Effort: Pulmonary effort is normal. No respiratory distress.  ?   Breath sounds: Normal breath sounds. No wheezing or rales.  ?Abdominal:  ?   General: There is no distension.  ?   Palpations: Abdomen is soft.  ?   Tenderness: There is no abdominal tenderness.  ?Musculoskeletal:     ?   General: No deformity.  ?   Cervical back: Neck supple.  ?   Right lower leg: No edema.  ?   Left lower leg: No edema.  ?Lymphadenopathy:  ?   Cervical: No cervical adenopathy.  ?Skin: ?   General: Skin is warm and dry.  ?Neurological:  ?   Mental Status: She is alert and oriented to person, place, and time. Mental status is at baseline.  ?   Gait: Gait normal.  ?Psychiatric:     ?   Mood and Affect: Mood normal.     ?   Behavior: Behavior normal.     ?   Thought Content: Thought content normal.  ? ? ? ?Most recent functional status assessment: ? ?  04/02/2022  ?  8:34 AM  ?In your present state of health, do you have any difficulty performing the following activities:  ?Hearing? 0  ?Vision? 0  ?Difficulty concentrating or making decisions? 0  ?Walking or climbing stairs? 0  ?Dressing or bathing? 0  ?Doing errands, shopping? 0  ? ?Most recent fall risk assessment: ? ?  04/02/2022  ?  8:33 AM  ?Fall Risk   ?Falls in the past year? 0  ?Number falls in past yr: 0  ?Injury with Fall? 0  ?Risk for fall due to : No Fall Risks  ?Follow up Falls evaluation  completed  ? ? Most recent depression screenings: ? ?  04/02/2022  ?  8:33 AM 04/01/2021  ?  9:08 AM  ?PHQ 2/9 Scores  ?PHQ - 2  Score 0 0  ?PHQ- 9 Score 1 0  ? ?Most recent cognitive screening: ? ?  04/02/2022  ?  8:35 AM  ?6CIT Screen  ?What Year? 0 points  ?What month? 0 points  ?What time? 0 points  ?Count back from 20 0 points  ?Months in reverse 0 points  ?Repeat phrase 0 points  ?Total Score 0 points  ? ?Most recent Audit-C alcohol use screening ? ?  04/02/2022  ?  8:35 AM  ?Alcohol Use Disorder Test (AUDIT)  ?1. How often do you have a drink containing alcohol? 1  ?2. How many drinks containing alcohol do you have on a typical day when you are drinking? 0  ?3. How often do you have six or more drinks on one occasion? 0  ?AUDIT-C Score 1  ? ?A score of 3 or more in women, and 4 or more in men indicates increased risk for alcohol abuse, EXCEPT if all of the points are from question 1  ? ?No results found for any visits on 04/02/22. ? Assessment & Plan  ?  ? ?Annual wellness visit done today including the all of the following: ?Reviewed patient's Family Medical History ?Reviewed and updated list of patient's medical providers ?Assessment of cognitive impairment was done ?Assessed patient's functional ability ?Established a written schedule for health screening services ?Health Risk Assessent Completed and Reviewed ? ?Exercise Activities and Dietary recommendations ? Goals   ? ?  Exercise 3x per week (30 min per time)   ?  Recommend to exercise for 3 days a week for at least 30 minutes at a time.  ?  ?  Increase water intake   ?  Recommend increasing water intake to 6-8 8 oz glasses a day.  ?  ? ?  ? ? ?Immunization History  ?Administered Date(s) Administered  ? Influenza, High Dose Seasonal PF 08/26/2017, 10/10/2018  ? PFIZER(Purple Top)SARS-COV-2 Vaccination 01/19/2020, 02/14/2020  ? Pneumococcal Conjugate-13 08/26/2017  ? Pneumococcal Polysaccharide-23 10/10/2018  ? Tdap 10/05/2011  ? Zoster Recombinat (Shingrix)  07/15/2018, 05/23/2019  ? ? ?Health Maintenance  ?Topic Date Due  ? COVID-19 Vaccine (3 - Booster for Pfizer series) 04/10/2020  ? MAMMOGRAM  07/06/2020  ? TETANUS/TDAP  10/04/2021  ? INFLUENZA VACCINE  06/23/2022  ? DE

## 2022-04-02 ENCOUNTER — Ambulatory Visit (INDEPENDENT_AMBULATORY_CARE_PROVIDER_SITE_OTHER): Payer: Medicare HMO | Admitting: Family Medicine

## 2022-04-02 ENCOUNTER — Encounter: Payer: Self-pay | Admitting: Family Medicine

## 2022-04-02 VITALS — BP 122/86 | HR 61 | Temp 98.7°F | Resp 16 | Ht 65.5 in | Wt 152.6 lb

## 2022-04-02 DIAGNOSIS — Z0000 Encounter for general adult medical examination without abnormal findings: Secondary | ICD-10-CM

## 2022-04-02 DIAGNOSIS — E663 Overweight: Secondary | ICD-10-CM

## 2022-04-02 DIAGNOSIS — E785 Hyperlipidemia, unspecified: Secondary | ICD-10-CM

## 2022-04-02 DIAGNOSIS — Z853 Personal history of malignant neoplasm of breast: Secondary | ICD-10-CM

## 2022-04-02 NOTE — Assessment & Plan Note (Signed)
Reviewed last lipid panel Not currently on a statin Recheck FLP and CMP Discussed diet and exercise  

## 2022-04-02 NOTE — Assessment & Plan Note (Signed)
Declines screening mammogram ?

## 2022-04-03 LAB — LIPID PANEL WITH LDL/HDL RATIO
Cholesterol, Total: 237 mg/dL — ABNORMAL HIGH (ref 100–199)
HDL: 49 mg/dL (ref >39)
LDL Chol Calc (NIH): 168 mg/dL — ABNORMAL HIGH (ref 0–99)
LDL/HDL Ratio: 3.4 ratio — ABNORMAL HIGH (ref 0.0–3.2)
Triglycerides: 111 mg/dL (ref 0–149)
VLDL Cholesterol Cal: 20 mg/dL (ref 5–40)

## 2022-04-03 LAB — COMPREHENSIVE METABOLIC PANEL
ALT: 37 IU/L — ABNORMAL HIGH (ref 0–32)
AST: 32 IU/L (ref 0–40)
Albumin/Globulin Ratio: 1.6 (ref 1.2–2.2)
Albumin: 4.5 g/dL (ref 3.8–4.8)
Alkaline Phosphatase: 68 IU/L (ref 44–121)
BUN/Creatinine Ratio: 19 (ref 12–28)
BUN: 15 mg/dL (ref 8–27)
Bilirubin Total: 0.3 mg/dL (ref 0.0–1.2)
CO2: 23 mmol/L (ref 20–29)
Calcium: 10.2 mg/dL (ref 8.7–10.3)
Chloride: 104 mmol/L (ref 96–106)
Creatinine, Ser: 0.77 mg/dL (ref 0.57–1.00)
Globulin, Total: 2.9 g/dL (ref 1.5–4.5)
Glucose: 90 mg/dL (ref 70–99)
Potassium: 5.2 mmol/L (ref 3.5–5.2)
Sodium: 142 mmol/L (ref 134–144)
Total Protein: 7.4 g/dL (ref 6.0–8.5)
eGFR: 83 mL/min/{1.73_m2} (ref >59)

## 2023-04-10 ENCOUNTER — Encounter: Payer: Self-pay | Admitting: Family Medicine

## 2023-04-12 ENCOUNTER — Ambulatory Visit (INDEPENDENT_AMBULATORY_CARE_PROVIDER_SITE_OTHER): Payer: Medicare HMO | Admitting: Family Medicine

## 2023-04-12 ENCOUNTER — Encounter: Payer: Self-pay | Admitting: Family Medicine

## 2023-04-12 VITALS — BP 148/82 | HR 84 | Ht 65.5 in | Wt 154.3 lb

## 2023-04-12 DIAGNOSIS — Z0000 Encounter for general adult medical examination without abnormal findings: Secondary | ICD-10-CM

## 2023-04-12 DIAGNOSIS — R030 Elevated blood-pressure reading, without diagnosis of hypertension: Secondary | ICD-10-CM

## 2023-04-12 DIAGNOSIS — E785 Hyperlipidemia, unspecified: Secondary | ICD-10-CM

## 2023-04-12 DIAGNOSIS — Z853 Personal history of malignant neoplasm of breast: Secondary | ICD-10-CM

## 2023-04-12 MED ORDER — DESOXIMETASONE 0.25 % EX CREA: 1.0000 | TOPICAL_CREAM | Freq: Two times a day (BID) | CUTANEOUS | 5 refills | Status: AC

## 2023-04-12 NOTE — Progress Notes (Signed)
Annual Wellness Visit     Patient: Lacey Jordan, Female    DOB: [birth year], 72 y.o.   MRN: [id number]  Subjective  Chief Complaint  Patient presents with   Annual Exam    Lacey Jordan is a 72 y.o. female who presents today for her Annual Wellness Visit. She reports consuming a general diet. The patient does not participate in regular exercise at present. She generally feels fairly well. She reports sleeping fairly well. She does not have additional problems to discuss today.   Right arm pain  Fell while gardening a few weeks ago and hit her upper right arm on the planter.  Pain is gradually improving.   Elevated blood pressure Previous in office blood pressure 122/86 in May 2023. Does not take medications and is not interested in starting any blood pressure medications.  Uses a lot of salt in her diet.   Preventative care Has previously declined mammogram screening.         Medications: Outpatient Medications Prior to Visit  Medication Sig   Cholecalciferol (VITAMIN D3) 125 MCG (5000 UT) TABS Take 5,000 Units by mouth daily.   ibuprofen (ADVIL,MOTRIN) 200 MG tablet Take 200 mg by mouth every 6 (six) hours as needed.   Melatonin 3 MG TABS Take 3 mg by mouth at bedtime as needed.   Menaquinone-7 (VITAMIN K2) 100 MCG CAPS Take 100 mcg by mouth daily at 6 (six) AM.   Multiple Vitamin (MULTIVITAMIN) tablet Take 1 tablet by mouth daily.   Omega 3 1200 MG CAPS Take 1,200 mg by mouth daily.   psyllium (REGULOID) 0.52 g capsule Take 2 capsules by mouth daily. *Metamucil* at bedtime   Thiamine HCl (VITAMIN B-1) 250 MG tablet Take 250 mg by mouth daily.   vitamin C (ASCORBIC ACID) 500 MG tablet Take 500 mg by mouth daily.   [DISCONTINUED] desoximetasone (TOPICORT) 0.25 % cream Apply 1 application topically 2 (two) times daily.   No facility-administered medications prior to visit.    Allergies  Allergen Reactions   Vitamin B12 Itching and Other (See Comments)    Adhesive [Tape] Rash    Band aid sensitivity   Nickel Rash    Patient Care Team: Erasmo Downer, MD as PCP - General (Family Medicine) Pa, [facility] (Optometry)  Review of Systems  HENT:  Positive for congestion.   Respiratory:  Positive for cough.   All other systems reviewed and are negative.       Objective  BP (!) 148/82 (BP Location: Left Arm, Patient Position: Sitting, Cuff Size: Normal)   Pulse 84   Ht 5' 5.5" (1.664 m)   Wt 154 lb 4.8 oz (70 kg)   BMI 25.29 kg/m    Physical Exam Constitutional:      General: She is not in acute distress. HENT:     Head: Normocephalic and atraumatic.  Eyes:     Pupils: Pupils are equal, round, and reactive to light.  Cardiovascular:     Pulses: Normal pulses.     Heart sounds: Normal heart sounds.  Pulmonary:     Effort: Pulmonary effort is normal.     Breath sounds: Normal breath sounds.  Abdominal:     General: Bowel sounds are normal. There is no distension.     Palpations: Abdomen is soft. There is no mass.     Tenderness: There is no abdominal tenderness.  Musculoskeletal:     Cervical back: Normal range of motion and neck supple.  Right lower leg: No edema.     Left lower leg: No edema.  Skin:    General: Skin is warm and dry.     Capillary Refill: Capillary refill takes less than 2 seconds.     Comments: Hematoma, 3 cm in diameter, on upper right arm.  Neurological:     Mental Status: She is alert.       Most recent functional status assessment:     No data to display         Most recent fall risk assessment:    04/12/2023    8:40 AM  Fall Risk   Falls in the past year? 1  Number falls in past yr: 0  Injury with Fall? 1  Risk for fall due to : History of fall(s)  Follow up Falls evaluation completed    Most recent depression screenings:    04/12/2023    8:40 AM 04/02/2022    8:33 AM  PHQ 2/9 Scores  PHQ - 2 Score 4 0  PHQ- 9 Score 14 1   Most recent cognitive  screening:    04/02/2022    8:35 AM  6CIT Screen  What Year? 0 points  What month? 0 points  What time? 0 points  Count back from 20 0 points  Months in reverse 0 points  Repeat phrase 0 points  Total Score 0 points   Most recent Audit-C alcohol use screening    04/12/2023    8:40 AM  Alcohol Use Disorder Test (AUDIT)  1. How often do you have a drink containing alcohol? 2  2. How many drinks containing alcohol do you have on a typical day when you are drinking? 0  3. How often do you have six or more drinks on one occasion? 0  AUDIT-C Score 2   A score of 3 or more in women, and 4 or more in men indicates increased risk for alcohol abuse, EXCEPT if all of the points are from question 1   Vision/Hearing Screen: No results found.    No results found for any visits on 04/12/23.    Assessment & Plan   Annual wellness visit done today including the all of the following: Reviewed patient's Family Medical History Reviewed and updated list of patient's medical providers Assessment of cognitive impairment was done Assessed patient's functional ability Established a written schedule for health screening services Health Risk Assessent Completed and Reviewed  Exercise Activities and Dietary recommendations  Goals      Exercise 3x per week (30 min per time)     Recommend to exercise for 3 days a week for at least 30 minutes at a time.      Increase water intake     Recommend increasing water intake to 6-8 8 oz glasses a day.         Immunization History  Administered Date(s) Administered   Influenza, High Dose Seasonal PF 08/26/2017, 10/10/2018   PFIZER(Purple Top)SARS-COV-2 Vaccination 01/19/2020, 02/14/2020   Pneumococcal Conjugate-13 08/26/2017   Pneumococcal Polysaccharide-23 10/10/2018   Tdap 10/05/2011   Zoster Recombinat (Shingrix) 07/15/2018, 05/23/2019    Health Maintenance  Topic Date Due   MAMMOGRAM  07/06/2020   DTaP/Tdap/Td (2 - Td or Tdap)  10/04/2021   COVID-19 Vaccine (3 - 2023-24 season) 07/24/2022   DEXA SCAN  04/24/2023   INFLUENZA VACCINE  06/24/2023   Medicare Annual Wellness (AWV)  04/11/2024   COLONOSCOPY (Pts 45-33yrs Insurance coverage will need to be confirmed)  01/22/2025  Pneumonia Vaccine 4+ Years old  Completed   Hepatitis C Screening  Completed   Zoster Vaccines- Shingrix  Completed   HPV VACCINES  Aged Out     Discussed health benefits of physical activity, and encouraged her to engage in regular exercise appropriate for her age and condition.    Problem List Items Addressed This Visit     History of breast cancer    Declines mammogram screening at today's visit.       Hyperlipidemia    Reviewed last lipid panel. Not currently on a statin. Recheck FLP and CMP. Discussed diet and exercise.       Relevant Orders   Comprehensive Metabolic Panel (CMET)   Lipid Profile   Other Visit Diagnoses     Encounter for annual wellness visit (AWV) in Medicare patient    -  Primary   Encounter for annual physical exam       Relevant Orders   Comprehensive Metabolic Panel (CMET)   Lipid Profile   Elevated BP without diagnosis of hypertension    Discussed the importance of a healthy diet and exercise.  Advised to start tracking blood pressure at home.  Ordered CMP at today's visit.     Relevant Orders   Comprehensive Metabolic Panel (CMET)       Return in about 1 year (around 04/11/2024) for CPE, AWV.     Lacey Jordan, Medical Student   Patient seen along with MS3 student Lacey Jordan. I personally evaluated this patient along with the student, and verified all aspects of the history, physical exam, and medical decision making as documented by the student. I agree with the student's documentation and have made all necessary edits.  Lacey Jordan, Lacey Schlein, MD, MPH [facility]

## 2023-04-12 NOTE — Assessment & Plan Note (Signed)
Reviewed last lipid panel Not currently on a statin Recheck FLP and CMP Discussed diet and exercise  

## 2023-04-12 NOTE — Assessment & Plan Note (Addendum)
Denies mammogram screening at today's visit.

## 2023-04-13 LAB — COMPREHENSIVE METABOLIC PANEL
ALT: 51 IU/L — ABNORMAL HIGH (ref 0–32)
AST: 47 IU/L — ABNORMAL HIGH (ref 0–40)
Albumin/Globulin Ratio: 1.7 (ref 1.2–2.2)
Albumin: 4.3 g/dL (ref 3.8–4.8)
Alkaline Phosphatase: 109 IU/L (ref 44–121)
BUN/Creatinine Ratio: 16 (ref 12–28)
BUN: 12 mg/dL (ref 8–27)
Bilirubin Total: 0.3 mg/dL (ref 0.0–1.2)
CO2: 26 mmol/L (ref 20–29)
Calcium: 9.7 mg/dL (ref 8.7–10.3)
Chloride: 103 mmol/L (ref 96–106)
Creatinine, Ser: 0.76 mg/dL (ref 0.57–1.00)
Globulin, Total: 2.5 g/dL (ref 1.5–4.5)
Glucose: 96 mg/dL (ref 70–99)
Potassium: 4.5 mmol/L (ref 3.5–5.2)
Sodium: 140 mmol/L (ref 134–144)
Total Protein: 6.8 g/dL (ref 6.0–8.5)
eGFR: 84 mL/min/{1.73_m2} (ref >59)

## 2023-04-13 LAB — LIPID PANEL
Chol/HDL Ratio: 4.6 ratio — ABNORMAL HIGH (ref 0.0–4.4)
Cholesterol, Total: 190 mg/dL (ref 100–199)
HDL: 41 mg/dL (ref >39)
LDL Chol Calc (NIH): 127 mg/dL — ABNORMAL HIGH (ref 0–99)
Triglycerides: 119 mg/dL (ref 0–149)
VLDL Cholesterol Cal: 22 mg/dL (ref 5–40)

## 2023-04-16 ENCOUNTER — Encounter: Payer: Medicare HMO | Admitting: Family Medicine

## 2024-02-02 ENCOUNTER — Encounter: Payer: Self-pay | Admitting: Family Medicine

## 2024-02-28 ENCOUNTER — Encounter: Payer: Self-pay | Admitting: Family Medicine

## 2024-04-13 ENCOUNTER — Encounter: Payer: Self-pay | Admitting: Family Medicine

## 2024-04-13 ENCOUNTER — Ambulatory Visit (INDEPENDENT_AMBULATORY_CARE_PROVIDER_SITE_OTHER): Payer: Self-pay | Admitting: Family Medicine

## 2024-04-13 VITALS — BP 132/74 | HR 79 | Ht 65.5 in | Wt 157.2 lb

## 2024-04-13 DIAGNOSIS — M8589 Other specified disorders of bone density and structure, multiple sites: Secondary | ICD-10-CM | POA: Diagnosis not present

## 2024-04-13 DIAGNOSIS — Z853 Personal history of malignant neoplasm of breast: Secondary | ICD-10-CM

## 2024-04-13 DIAGNOSIS — E785 Hyperlipidemia, unspecified: Secondary | ICD-10-CM | POA: Diagnosis not present

## 2024-04-13 DIAGNOSIS — Z0001 Encounter for general adult medical examination with abnormal findings: Secondary | ICD-10-CM | POA: Diagnosis not present

## 2024-04-13 DIAGNOSIS — Z0000 Encounter for general adult medical examination without abnormal findings: Secondary | ICD-10-CM

## 2024-04-13 NOTE — Progress Notes (Signed)
 Annual Wellness Visit     Patient: Lacey Jordan, Female    DOB: 1951/05/23, 73 y.o.   MRN: [id number] Visit Date: 04/13/2024  Today's Provider: Aden Agreste, MD   Chief Complaint  Patient presents with   Annual Exam    Last completed 04/12/23 Diet -  General, well balanced Exercise - none Feeling - well Sleeping - well Concerns -  hand tremors   Care Management    Mammogram - declined Tetanus Vaccine - Advised to visit pharmacy Dexa Scan -declined   Tremors    Patient reports she has had slight tremors in right hand for years but noticed last summer it has worsened to the point she has tremors in both hands   Subjective    Lacey Jordan is a 73 y.o. female who presents today for her Annual Wellness Visit.  Discussed the use of AI scribe software for clinical note transcription with the patient, who gave verbal consent to proceed.  History of Present Illness   Lacey Jordan "Lacey Jordan" is a 73 year old female who presents for CPE/AWV, also with concerns about side effects from the COVID vaccine.  She experiences tremors in her right hand since the initial COVID vaccine series in 2021, with subsequent development of tremors in her left hand after a booster shot. She is considering treatments such as augmented NAC.  She takes hyaluronic acid plus MSM for arm injuries that are slow to heal, starting this regimen a month ago. She also takes vitamin D supplements at 5000 IU daily with K2. Her vitamin D levels were high in 2021. She has osteopenia.  She is up to date with tetanus, shingles, and pneumonia vaccines and is aware of an upcoming colonoscopy next year.             Medications: Outpatient Medications Prior to Visit  Medication Sig   Cholecalciferol (VITAMIN D3) 125 MCG (5000 UT) TABS Take 5,000 Units by mouth daily.   desoximetasone  (TOPICORT ) 0.25 % cream Apply 1 Application topically 2 (two) times daily.   ibuprofen (ADVIL,MOTRIN) 200 MG tablet  Take 200 mg by mouth every 6 (six) hours as needed.   Melatonin 3 MG TABS Take 3 mg by mouth at bedtime as needed.   Menaquinone-7 (VITAMIN K2) 100 MCG CAPS Take 100 mcg by mouth daily at 6 (six) AM.   Multiple Vitamin (MULTIVITAMIN) tablet Take 1 tablet by mouth daily.   Omega 3 1200 MG CAPS Take 1,200 mg by mouth daily.   psyllium (REGULOID) 0.52 g capsule Take 2 capsules by mouth daily. *Metamucil* at bedtime   [DISCONTINUED] Thiamine HCl (VITAMIN B-1) 250 MG tablet Take 250 mg by mouth daily. (Patient not taking: Reported on 04/13/2024)   [DISCONTINUED] vitamin C (ASCORBIC ACID) 500 MG tablet Take 500 mg by mouth daily. (Patient not taking: Reported on 04/13/2024)   No facility-administered medications prior to visit.    Allergies  Allergen Reactions   Vitamin B12 Itching and Other (See Comments)   Adhesive [Tape] Rash    Curad brand sensitivity   Nickel Rash    Patient Care Team: Mazie Speed, MD as PCP - General (Family Medicine) [facility], [facility] (Optometry)  Review of Systems       Objective    Vitals: BP 132/74 (BP Location: Left Arm, Patient Position: Sitting, Cuff Size: Normal)   Pulse 79   Ht 5' 5.5" (1.664 m)   Wt 157 lb 3.2 oz (71.3 kg)   SpO2 100%  BMI 25.76 kg/m      Physical Exam Vitals reviewed.  Constitutional:      General: She is not in acute distress.    Appearance: Normal appearance. She is well-developed. She is not diaphoretic.  HENT:     Head: Normocephalic and atraumatic.     Right Ear: Tympanic membrane, ear canal and external ear normal.     Left Ear: Tympanic membrane, ear canal and external ear normal.     Nose: Nose normal.     Mouth/Throat:     Mouth: Mucous membranes are moist.     Pharynx: Oropharynx is clear. No oropharyngeal exudate.  Eyes:     General: No scleral icterus.    Conjunctiva/sclera: Conjunctivae normal.     Pupils: Pupils are equal, round, and reactive to light.  Neck:     Thyroid: No thyromegaly.   Cardiovascular:     Rate and Rhythm: Normal rate and regular rhythm.     Heart sounds: Normal heart sounds. No murmur heard. Pulmonary:     Effort: Pulmonary effort is normal. No respiratory distress.     Breath sounds: Normal breath sounds. No wheezing or rales.  Abdominal:     General: There is no distension.     Palpations: Abdomen is soft.     Tenderness: There is no abdominal tenderness.  Musculoskeletal:        General: No deformity.     Cervical back: Neck supple.     Right lower leg: No edema.     Left lower leg: No edema.  Lymphadenopathy:     Cervical: No cervical adenopathy.  Skin:    General: Skin is warm and dry.     Findings: No rash.  Neurological:     Mental Status: She is alert and oriented to person, place, and time. Mental status is at baseline.     Gait: Gait normal.  Psychiatric:        Mood and Affect: Mood normal.        Behavior: Behavior normal.        Thought Content: Thought content normal.     Most recent functional status assessment:    04/13/2024    8:36 AM  In your present state of health, do you have any difficulty performing the following activities:  Hearing? 0  Vision? 1  Difficulty concentrating or making decisions? 0  Walking or climbing stairs? 0  Dressing or bathing? 0  Doing errands, shopping? 0  Preparing Food and eating ? N  Using the Toilet? N  In the past six months, have you accidently leaked urine? Y  Do you have problems with loss of bowel control? N  Managing your Medications? N  Managing your Finances? N  Housekeeping or managing your Housekeeping? N   Most recent fall risk assessment:    04/13/2024    8:38 AM  Fall Risk   Falls in the past year? 0  Number falls in past yr: 0  Injury with Fall? 0  Risk for fall due to : No Fall Risks  Follow up Falls evaluation completed    Most recent depression screenings:    04/13/2024    8:40 AM 04/12/2023    8:40 AM  PHQ 2/9 Scores  PHQ - 2 Score 0 4  PHQ- 9 Score   14   Most recent cognitive screening:    04/02/2022    8:35 AM  6CIT Screen  What Year? 0 points  What month? 0 points  What time? 0 points  Count back from 20 0 points  Months in reverse 0 points  Repeat phrase 0 points  Total Score 0 points   Most recent Audit-C alcohol use screening    04/13/2024    8:40 AM  Alcohol Use Disorder Test (AUDIT)  1. How often do you have a drink containing alcohol? 1  2. How many drinks containing alcohol do you have on a typical day when you are drinking? 0  3. How often do you have six or more drinks on one occasion? 0  AUDIT-C Score 1   A score of 3 or more in women, and 4 or more in men indicates increased risk for alcohol abuse, EXCEPT if all of the points are from question 1   No results found.  No results found for any visits on 04/13/24.  Assessment & Plan     Annual wellness visit done today including the all of the following: Reviewed patient's Family Medical History Reviewed and updated list of patient's medical providers Assessment of cognitive impairment was done Assessed patient's functional ability Established a written schedule for health screening services Health Risk Assessent Completed and Reviewed  Exercise Activities and Dietary recommendations  Goals      Exercise 3x per week (30 min per time)     Recommend to exercise for 3 days a week for at least 30 minutes at a time.      Increase water intake     Recommend increasing water intake to 6-8 8 oz glasses a day.         Immunization History  Administered Date(s) Administered   Influenza, High Dose Seasonal PF 08/26/2017, 10/10/2018   PFIZER(Purple Top)SARS-COV-2 Vaccination 01/19/2020, 02/14/2020   Pneumococcal Conjugate-13 08/26/2017   Pneumococcal Polysaccharide-23 10/10/2018   Tdap 10/05/2011, 04/14/2023   Zoster Recombinant(Shingrix) 07/19/2018, 05/23/2019    Health Maintenance  Topic Date Due   COVID-19 Vaccine (3 - 2024-25 season) 07/25/2023    MAMMOGRAM  04/13/2025 (Originally 07/06/2020)   DEXA SCAN  04/13/2025 (Originally 04/24/2023)   INFLUENZA VACCINE  06/23/2024   Colonoscopy  01/22/2025   Medicare Annual Wellness (AWV)  04/13/2025   DTaP/Tdap/Td (3 - Td or Tdap) 04/13/2033   Pneumonia Vaccine 98+ Years old  Completed   Hepatitis C Screening  Completed   Zoster Vaccines- Shingrix  Completed   HPV VACCINES  Aged Out   Meningococcal B Vaccine  Aged Out     Discussed health benefits of physical activity, and encouraged her to engage in regular exercise appropriate for her age and condition.    Problem List Items Addressed This Visit       Musculoskeletal and Integument   Osteopenia   Relevant Orders   VITAMIN D 25 Hydroxy (Vit-D Deficiency, Fractures)     Other   History of breast cancer   Hyperlipidemia   Relevant Orders   Comprehensive metabolic panel with GFR   Lipid panel   Other Visit Diagnoses       Encounter for annual wellness exam in Medicare patient    -  Primary     Encounter for annual physical exam               Tremors Tremors in the right hand began after the initial COVID-19 vaccination series in 2021, with subsequent tremors in the left hand following a booster. She is concerned about potential side effects from the COVID-19 vaccine. There is no current evidence supporting the use of NAC or augmented  NAC for vaccine-related tremors, although NAC is currently popular for various uses. Hyaluronic acid and MSM are being used for joint pain, but there is no evidence supporting their use for tremors.  Hypertension Blood pressure was elevated at the start of the visit but improved to 132/74 mmHg after rechecking at the end of the visit. She has a home blood pressure monitor but has not been using it regularly. - Encourage regular home blood pressure monitoring.  Osteopenia Osteopenia was noted on the last bone density scan. Vitamin D levels were previously high, but re-evaluation is warranted  given the time elapsed since the last check in 2021. - Order vitamin D level. - declines repeat DEXA  General Health Maintenance She is up to date on tetanus, shingles, and pneumonia vaccinations. A colonoscopy is due next year. Routine labs including kidney and liver function, cholesterol, and blood sugar are due. She is taking vitamin D and K2 supplements. - Order routine labs including kidney and liver function, cholesterol, and blood sugar. - Schedule colonoscopy for next year. - declines DEXA and mammogram      Return in about 1 year (around 04/13/2025) for AWV, CPE.     Aden Agreste, MD  [facility] [phone] (phone) [phone] (fax)  [facility]

## 2024-04-14 ENCOUNTER — Ambulatory Visit: Payer: Self-pay | Admitting: Family Medicine

## 2024-04-14 LAB — COMPREHENSIVE METABOLIC PANEL WITH GFR
ALT: 47 IU/L — ABNORMAL HIGH (ref 0–32)
AST: 29 IU/L (ref 0–40)
Albumin: 4.5 g/dL (ref 3.8–4.8)
Alkaline Phosphatase: 90 IU/L (ref 44–121)
BUN/Creatinine Ratio: 19 (ref 12–28)
BUN: 13 mg/dL (ref 8–27)
Bilirubin Total: 0.3 mg/dL (ref 0.0–1.2)
CO2: 23 mmol/L (ref 20–29)
Calcium: 10.6 mg/dL — ABNORMAL HIGH (ref 8.7–10.3)
Chloride: 105 mmol/L (ref 96–106)
Creatinine, Ser: 0.69 mg/dL (ref 0.57–1.00)
Globulin, Total: 2.7 g/dL (ref 1.5–4.5)
Glucose: 89 mg/dL (ref 70–99)
Potassium: 5.1 mmol/L (ref 3.5–5.2)
Sodium: 142 mmol/L (ref 134–144)
Total Protein: 7.2 g/dL (ref 6.0–8.5)
eGFR: 92 mL/min/{1.73_m2} (ref >59)

## 2024-04-14 LAB — LIPID PANEL
Chol/HDL Ratio: 4.8 ratio — ABNORMAL HIGH (ref 0.0–4.4)
Cholesterol, Total: 210 mg/dL — ABNORMAL HIGH (ref 100–199)
HDL: 44 mg/dL (ref >39)
LDL Chol Calc (NIH): 141 mg/dL — ABNORMAL HIGH (ref 0–99)
Triglycerides: 141 mg/dL (ref 0–149)
VLDL Cholesterol Cal: 25 mg/dL (ref 5–40)

## 2024-04-14 LAB — VITAMIN D 25 HYDROXY (VIT D DEFICIENCY, FRACTURES): Vit D, 25-Hydroxy: 114 ng/mL — ABNORMAL HIGH (ref 30.0–100.0)

## 2024-07-29 ENCOUNTER — Encounter: Payer: Self-pay | Admitting: Family Medicine

## 2024-08-01 MED ORDER — AZELASTINE HCL 0.1 % NA SOLN: 1.0000 | Freq: Two times a day (BID) | NASAL | 12 refills | Status: AC

## 2025-04-17 ENCOUNTER — Encounter: Admitting: Family Medicine
